# Patient Record
Sex: Female | Born: 1980 | Race: White | Hispanic: No | Marital: Married | Smoking: Never smoker
Health system: Southern US, Community
[De-identification: ages and names within clinical notes are randomized; demographics above are authoritative.]

## PROBLEM LIST (undated history)

## (undated) DIAGNOSIS — D696 Thrombocytopenia, unspecified: Secondary | ICD-10-CM

## (undated) DIAGNOSIS — O9989 Other specified diseases and conditions complicating pregnancy, childbirth and the puerperium: Secondary | ICD-10-CM

## (undated) DIAGNOSIS — J0100 Acute maxillary sinusitis, unspecified: Secondary | ICD-10-CM

## (undated) DIAGNOSIS — O99119 Other diseases of the blood and blood-forming organs and certain disorders involving the immune mechanism complicating pregnancy, unspecified trimester: Secondary | ICD-10-CM

## (undated) DIAGNOSIS — B029 Zoster without complications: Secondary | ICD-10-CM

## (undated) DIAGNOSIS — R51 Headache: Secondary | ICD-10-CM

## (undated) DIAGNOSIS — G43909 Migraine, unspecified, not intractable, without status migrainosus: Secondary | ICD-10-CM

## (undated) DIAGNOSIS — D6800 Von Willebrand disease, unspecified: Secondary | ICD-10-CM

## (undated) DIAGNOSIS — J988 Other specified respiratory disorders: Secondary | ICD-10-CM

## (undated) DIAGNOSIS — IMO0002 Reserved for concepts with insufficient information to code with codable children: Secondary | ICD-10-CM

## (undated) DIAGNOSIS — M3500 Sicca syndrome, unspecified: Secondary | ICD-10-CM

## (undated) DIAGNOSIS — M329 Systemic lupus erythematosus, unspecified: Secondary | ICD-10-CM

## (undated) DIAGNOSIS — Z8619 Personal history of other infectious and parasitic diseases: Secondary | ICD-10-CM

## (undated) HISTORY — DX: Other specified diseases and conditions complicating pregnancy, childbirth and the puerperium: O99.89

## (undated) HISTORY — DX: Von Willebrand's disease: D68.0

## (undated) HISTORY — DX: Headache: R51

## (undated) HISTORY — PX: TONSILLECTOMY: SUR1361

## (undated) HISTORY — DX: Von Willebrand disease, unspecified: D68.00

## (undated) HISTORY — DX: Zoster without complications: B02.9

## (undated) HISTORY — DX: Sjogren syndrome, unspecified: M35.00

## (undated) HISTORY — DX: Systemic lupus erythematosus, unspecified: M32.9

## (undated) HISTORY — DX: Thrombocytopenia, unspecified: D69.6

## (undated) HISTORY — DX: Personal history of other infectious and parasitic diseases: Z86.19

## (undated) HISTORY — DX: Migraine, unspecified, not intractable, without status migrainosus: G43.909

## (undated) HISTORY — DX: Reserved for concepts with insufficient information to code with codable children: IMO0002

## (undated) HISTORY — DX: Other specified respiratory disorders: J98.8

## (undated) HISTORY — DX: Other diseases of the blood and blood-forming organs and certain disorders involving the immune mechanism complicating pregnancy, unspecified trimester: O99.119

## (undated) HISTORY — DX: Acute maxillary sinusitis, unspecified: J01.00

---
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
## 2002-12-19 ENCOUNTER — Other Ambulatory Visit: Admission: RE | Admit: 2002-12-19 | Discharge: 2002-12-19 | Payer: Self-pay | Admitting: Gynecology

## 2003-04-14 ENCOUNTER — Emergency Department (HOSPITAL_COMMUNITY): Admission: EM | Admit: 2003-04-14 | Discharge: 2003-04-14 | Payer: Self-pay | Admitting: *Deleted

## 2003-04-26 ENCOUNTER — Ambulatory Visit (HOSPITAL_COMMUNITY): Admission: RE | Admit: 2003-04-26 | Discharge: 2003-04-26 | Payer: Self-pay | Admitting: Family Medicine

## 2004-01-08 ENCOUNTER — Other Ambulatory Visit: Admission: RE | Admit: 2004-01-08 | Discharge: 2004-01-08 | Payer: Self-pay | Admitting: Gynecology

## 2004-08-04 IMAGING — US US ABDOMEN COMPLETE
1 series · 14 of 25 positions shown · non-contrast
Comparison: none

CLINICAL DATA: Epigastric abdominal pain.
 COMPLETE ABDOMINAL ULTRASOUND 04/26/03 
 The liver is normal in size and echotexture and contains no focal abnormalities.  The gallbladder is well visualized and is normal in appearance.  There is no biliary ductal dilation as the common duct measures approximately 3 mm in the porta hepatis.  The pancreas is well visualized and has a normal appearance.  The spleen is normal in size and echotexture and measures approximately 9.7 cm in greatest sagittal length.  
 Both kidneys are normal in size and echotexture and contain no focal abnormalities and there is no evidence of hydronephrosis.  Cortical thickness is well preserved in both kidneys.  The right kidney measures approximately 10.0 cm and the left measures approximately 10.6 cm in greatest sagittal length.  The abdominal aorta is of normal caliber through its visualized course in the abdomen.
 IMPRESSION
 Normal abdominal ultrasound.

[Series 1: unknown · 0.30mm/px · 14 of 88 slices shown]
[im 1/88]
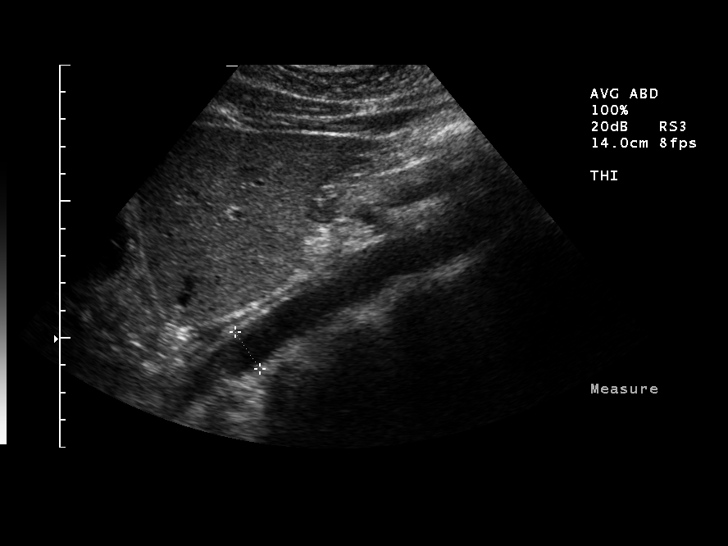
[im 8/88]
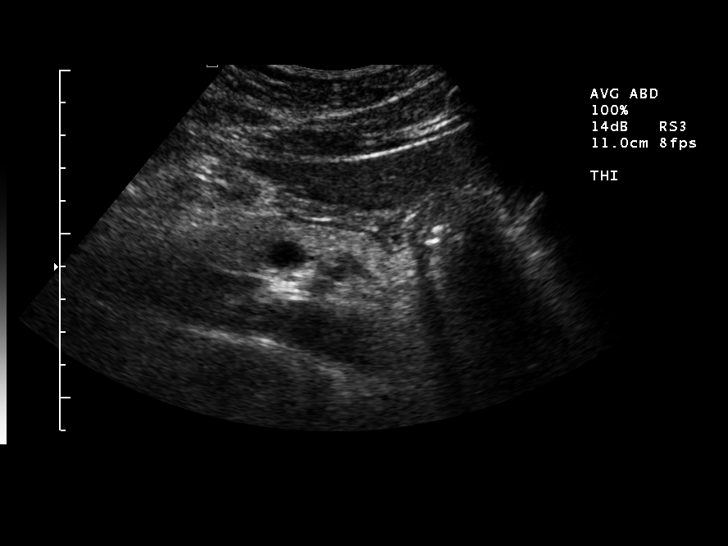
[im 15/88]
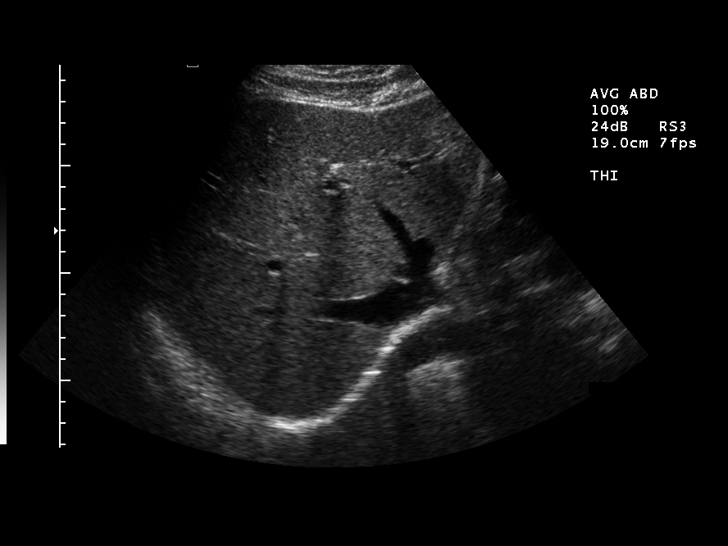
[im 22/88]
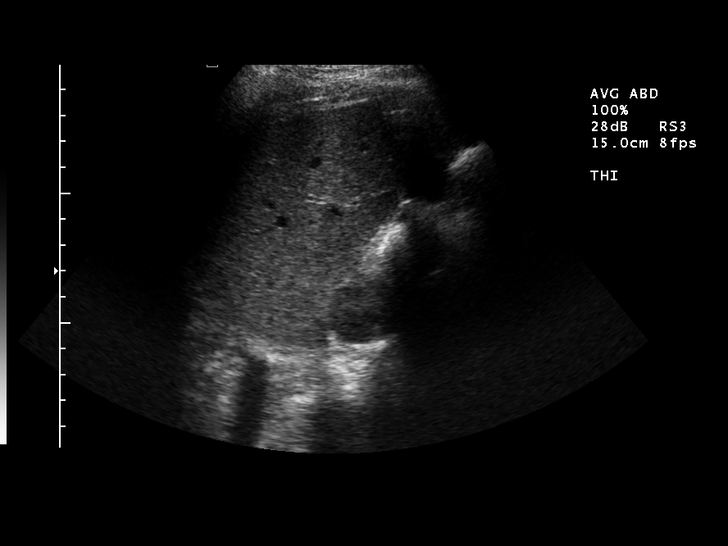
[im 30/88]
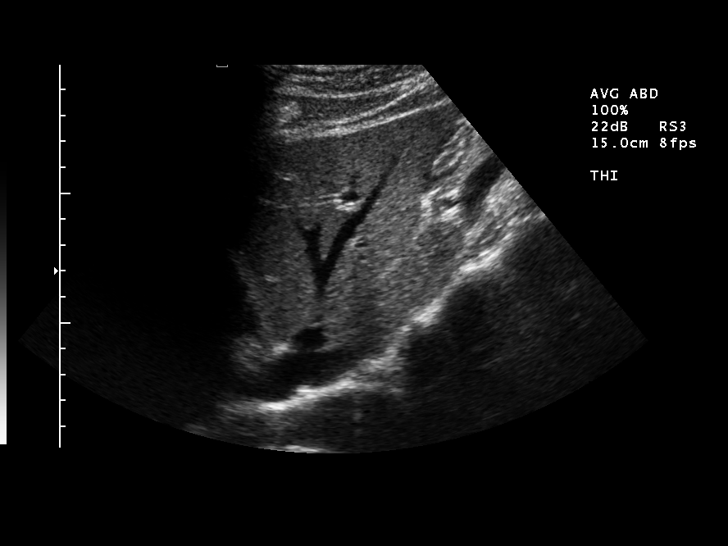
[im 33/88]
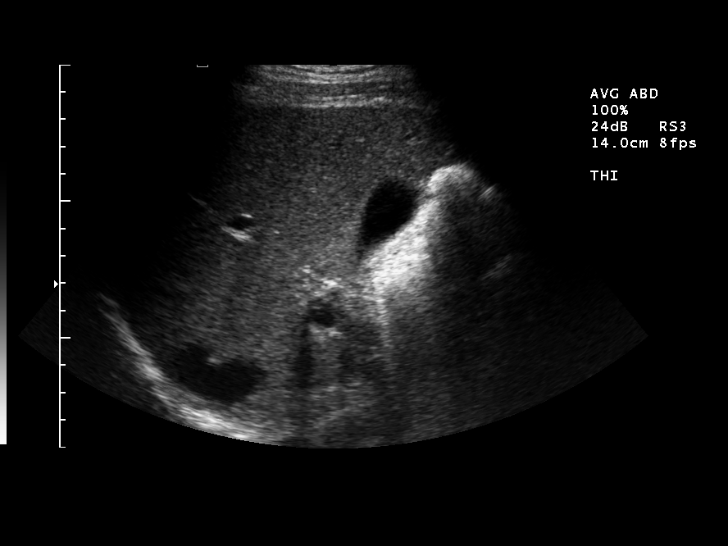
[im 40/88]
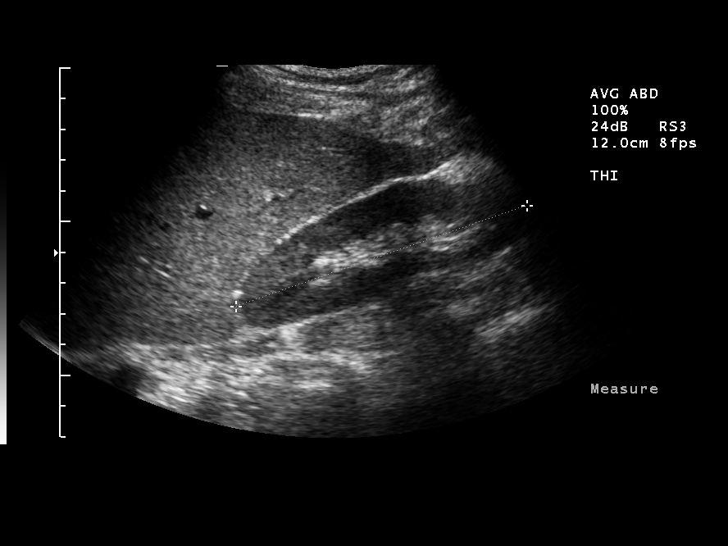
[im 48/88]
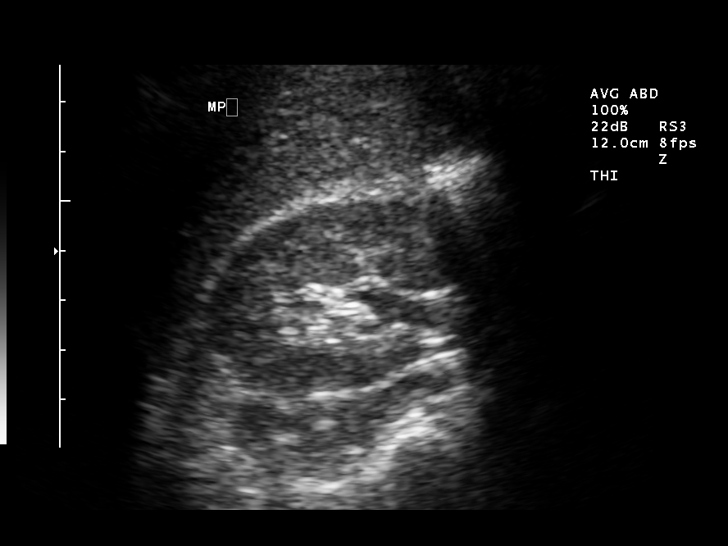
[im 55/88]
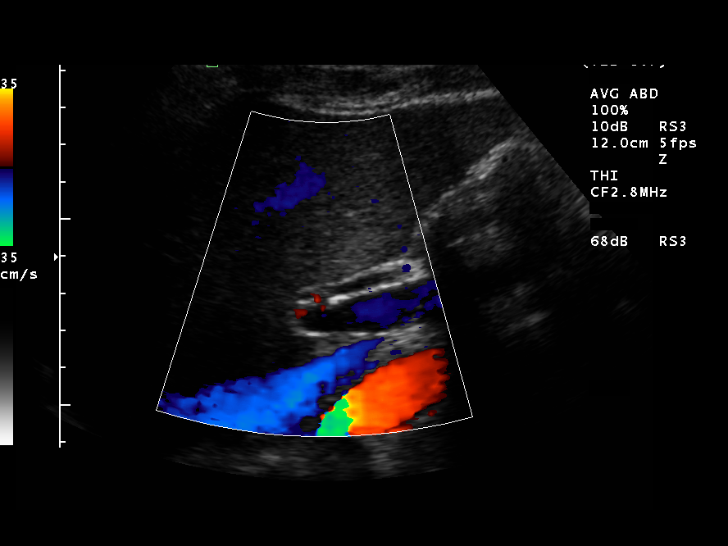
[im 59/88]
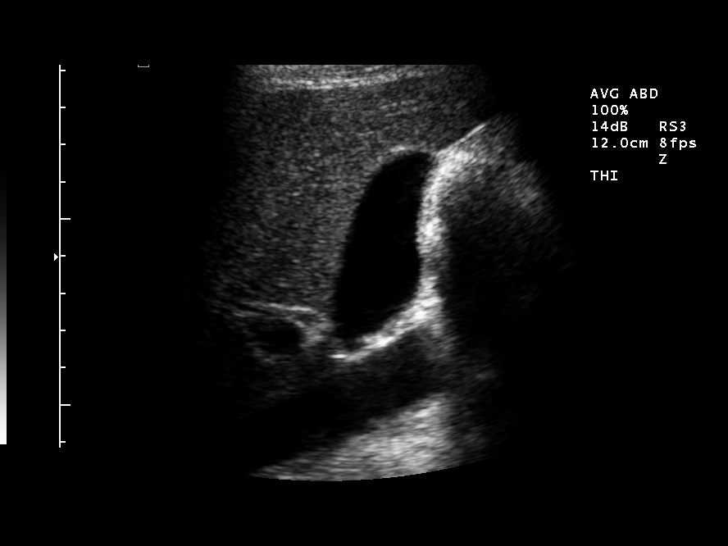
[im 66/88]
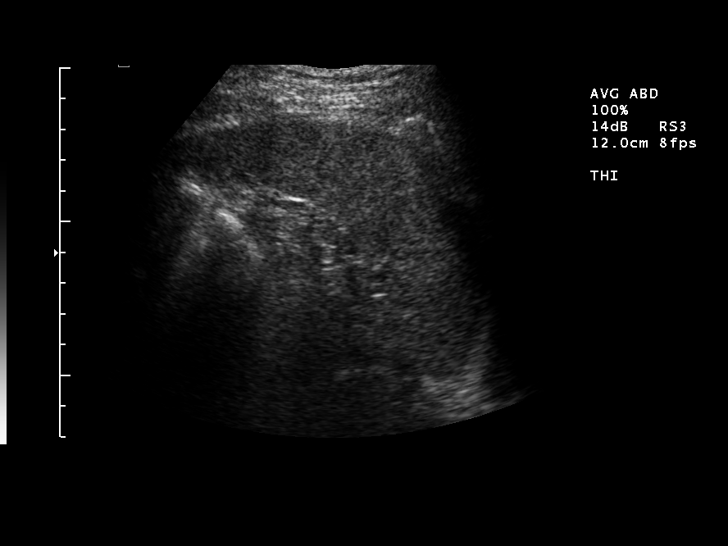
[im 73/88]
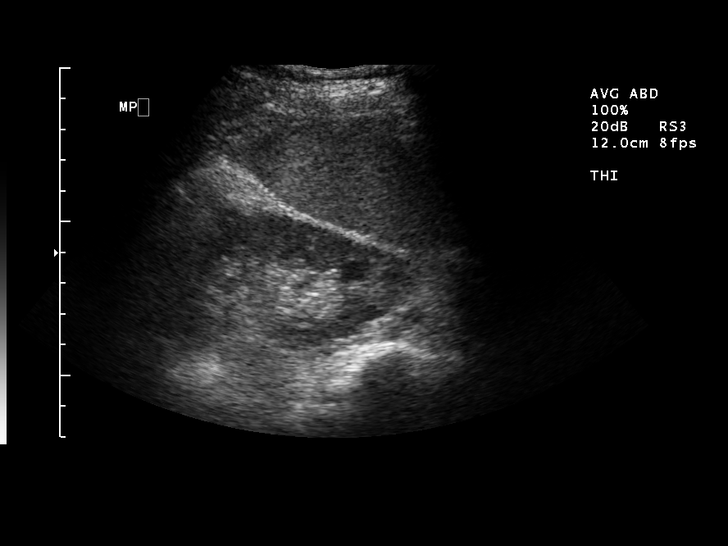
[im 80/88]
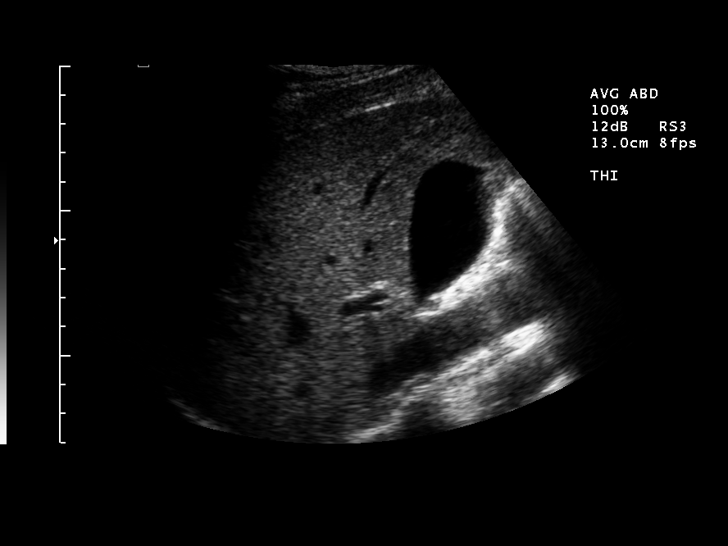
[im 88/88]
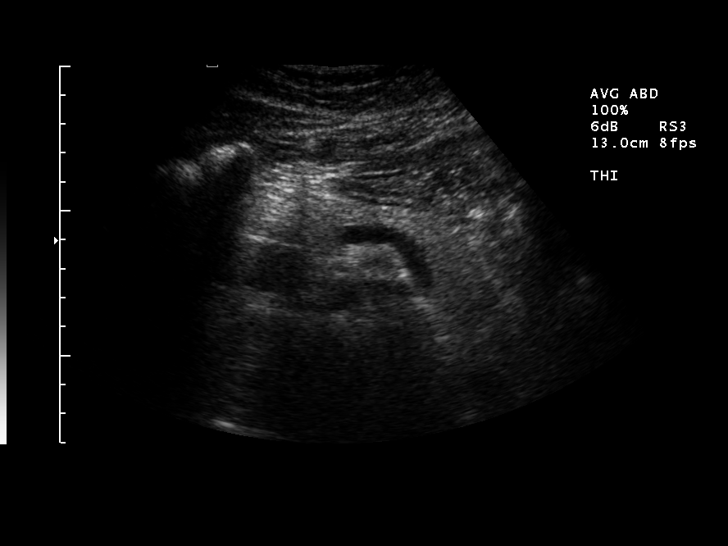

[14 of 25 positions shown; findings below may reference images not displayed]

## 2008-07-26 ENCOUNTER — Ambulatory Visit (HOSPITAL_COMMUNITY): Admission: RE | Admit: 2008-07-26 | Discharge: 2008-07-26 | Payer: Self-pay | Admitting: Obstetrics

## 2008-08-24 ENCOUNTER — Ambulatory Visit (HOSPITAL_COMMUNITY): Admission: RE | Admit: 2008-08-24 | Discharge: 2008-08-24 | Payer: Self-pay | Admitting: Obstetrics

## 2008-12-28 ENCOUNTER — Inpatient Hospital Stay (HOSPITAL_COMMUNITY): Admission: AD | Admit: 2008-12-28 | Discharge: 2008-12-30 | Payer: Self-pay | Admitting: Obstetrics

## 2009-12-03 IMAGING — US US OB FOLLOW-UP
1 series · 14 of 28 positions shown · non-contrast
Comparison: none

OBSTETRICAL ULTRASOUND:
 This ultrasound was performed in The [HOSPITAL], and the [facility] report will be stored to [REDACTED] PACS.

[Series 1: us ob follow-up · 52 acquisitions, 14 frames shown]
[im 2/52]
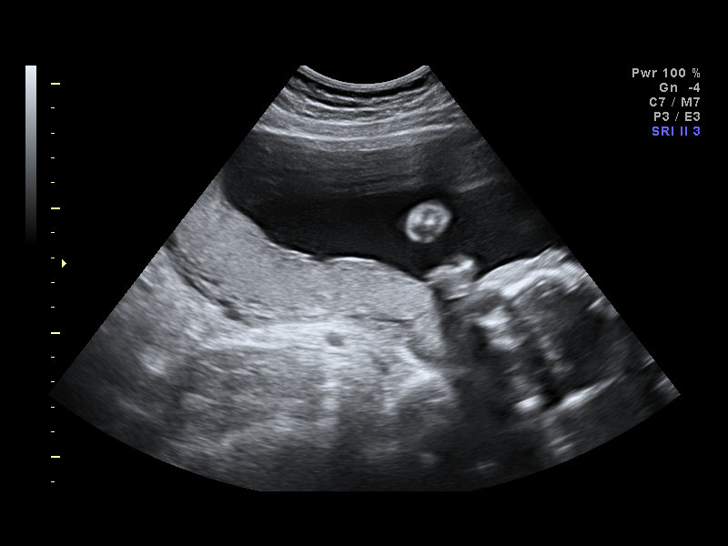
[im 6/52]
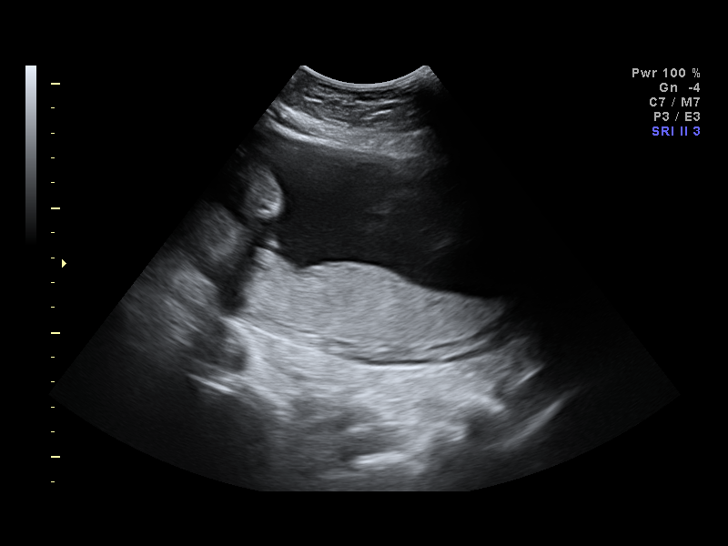
[im 10/52]
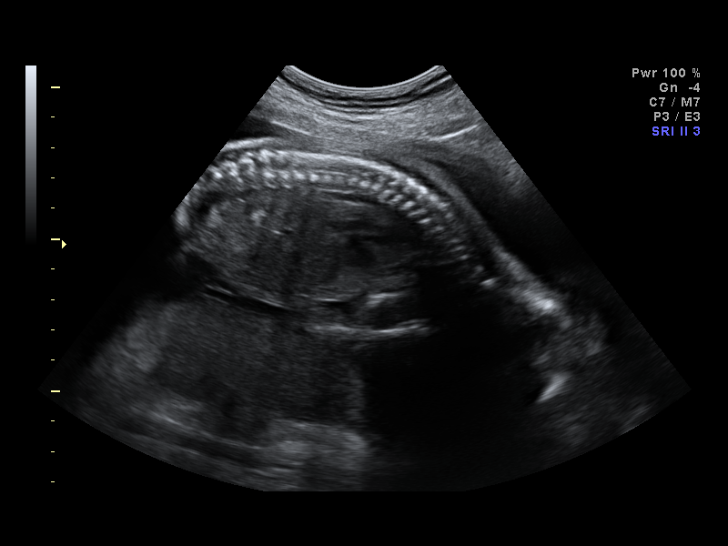
[im 14/52]
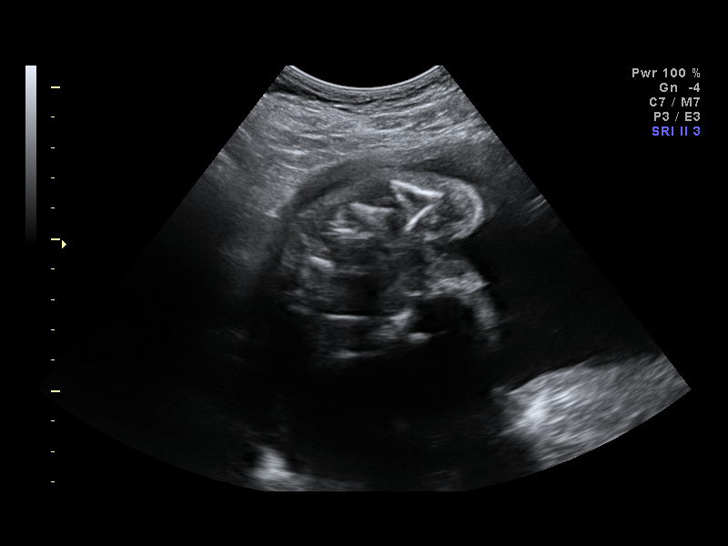
[im 18/52]
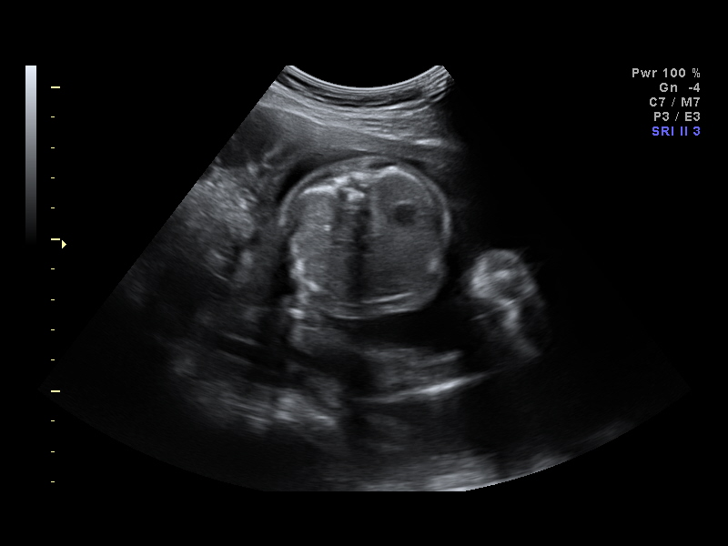
[im 21/52]
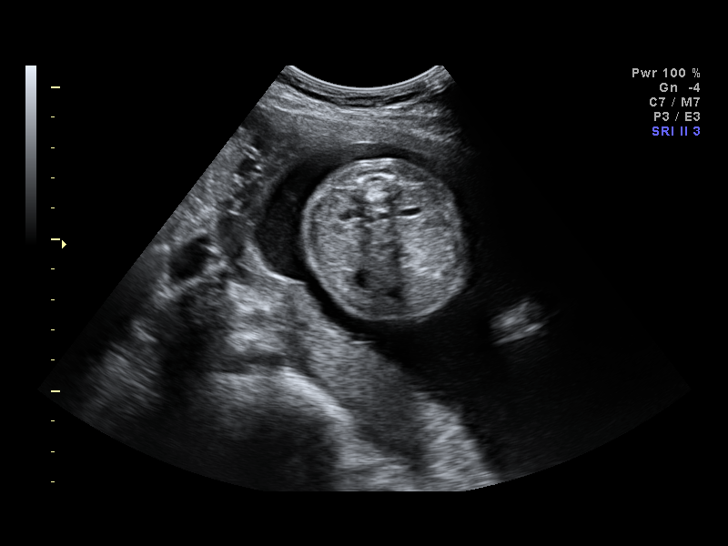
[im 25/52]
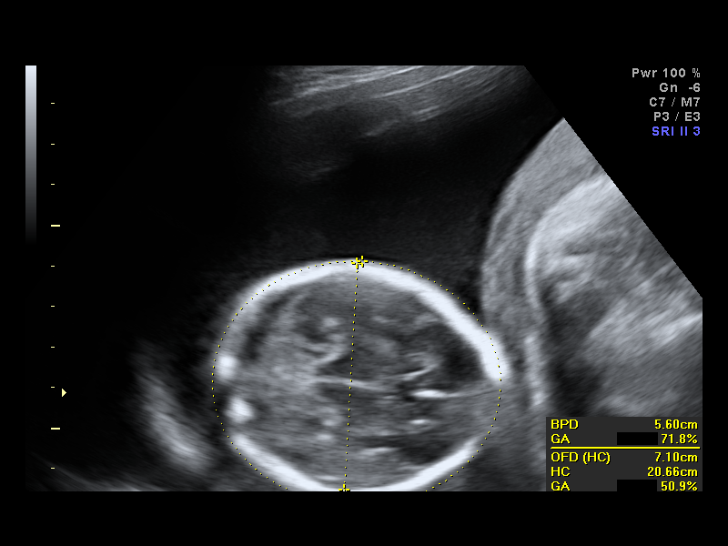
[im 29/52]
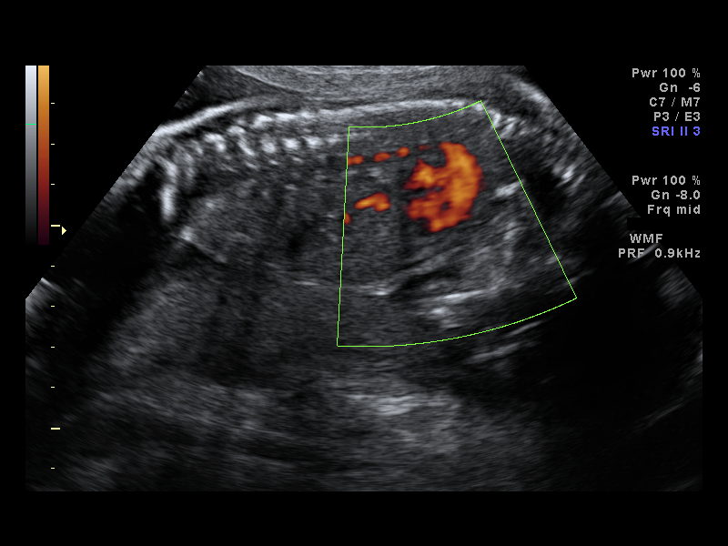
[im 33/52]
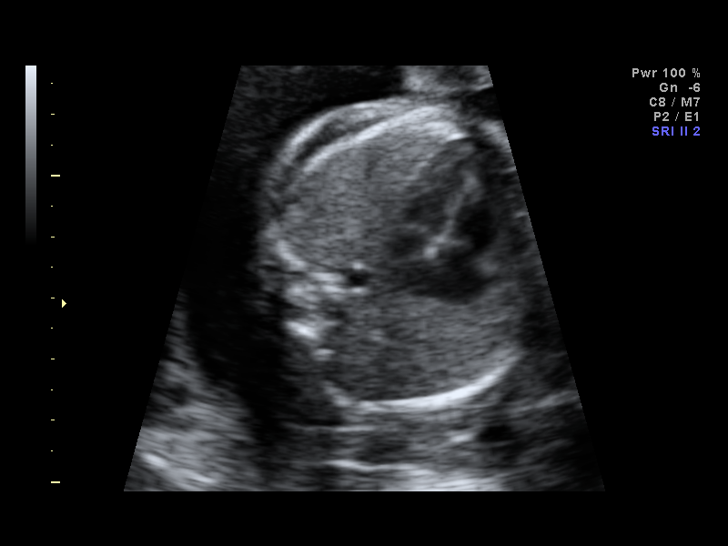
[im 36/52]
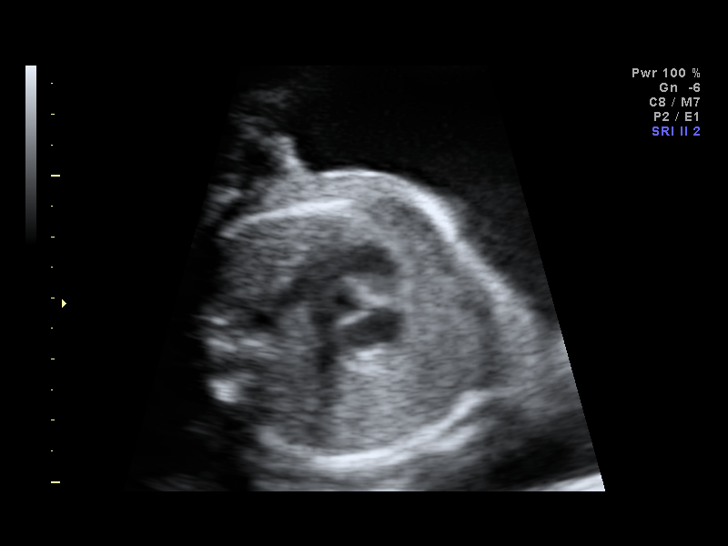
[im 40/52]
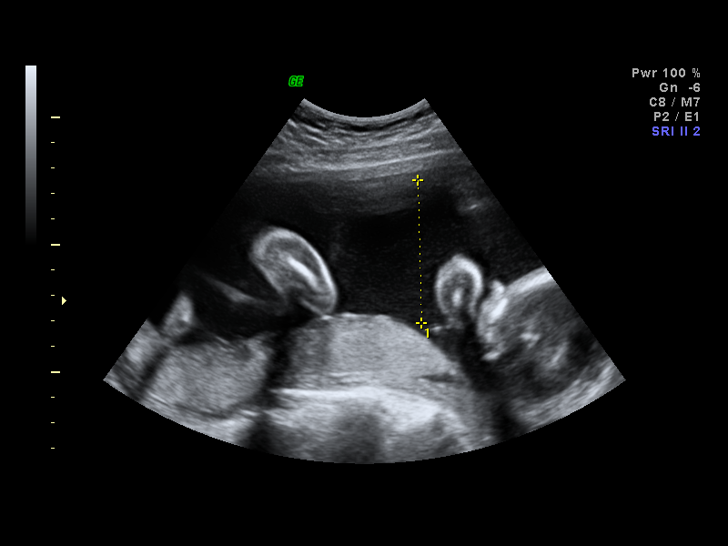
[im 44/52]
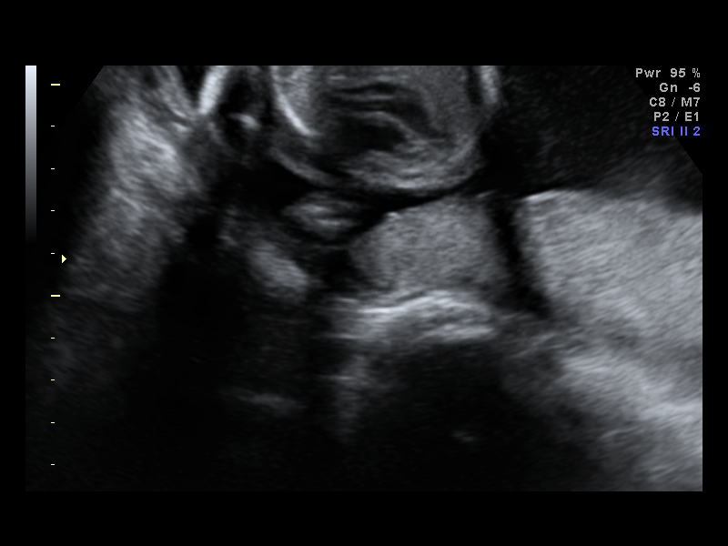
[im 48/52]
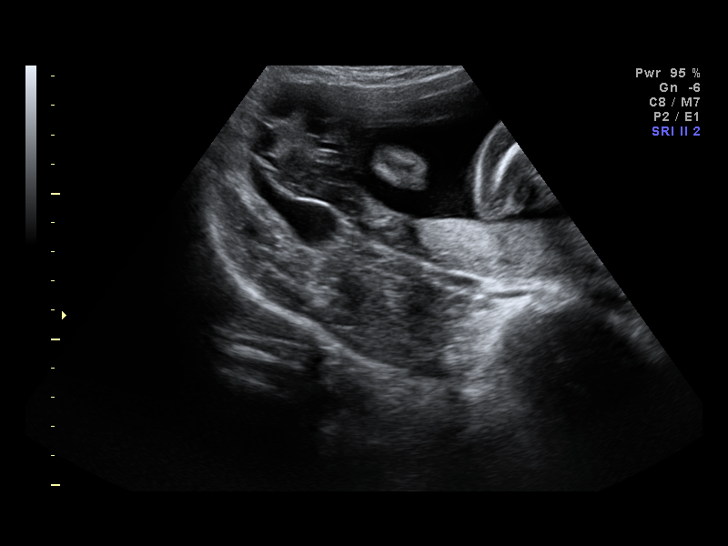
[im 52/52]
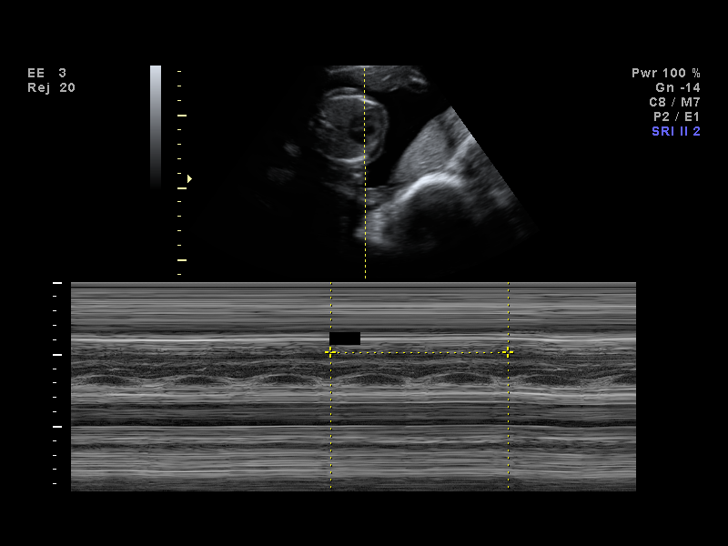

[14 of 28 positions shown; findings below may reference images not displayed]

IMPRESSION: [facility] has also been faxed to the ordering physician.

## 2010-05-19 ENCOUNTER — Encounter: Payer: Self-pay | Admitting: Obstetrics

## 2010-08-02 LAB — CBC
HCT: 35.5 % — ABNORMAL LOW (ref 36.0–46.0)
HCT: 39.6 % (ref 36.0–46.0)
Hemoglobin: 12.4 g/dL (ref 12.0–15.0)
Hemoglobin: 13 g/dL (ref 12.0–15.0)
Hemoglobin: 13.7 g/dL (ref 12.0–15.0)
Hemoglobin: 9.8 g/dL — ABNORMAL LOW (ref 12.0–15.0)
MCHC: 33.9 g/dL (ref 30.0–36.0)
MCHC: 34.8 g/dL (ref 30.0–36.0)
MCHC: 35 g/dL (ref 30.0–36.0)
MCV: 95 fL (ref 78.0–100.0)
MCV: 95.4 fL (ref 78.0–100.0)
Platelets: 122 10*3/uL — ABNORMAL LOW (ref 150–400)
Platelets: 130 10*3/uL — ABNORMAL LOW (ref 150–400)
Platelets: 137 10*3/uL — ABNORMAL LOW (ref 150–400)
RBC: 4.03 MIL/uL (ref 3.87–5.11)
RDW: 12.9 % (ref 11.5–15.5)
RDW: 13.4 % (ref 11.5–15.5)
RDW: 13.4 % (ref 11.5–15.5)
WBC: 8.7 10*3/uL (ref 4.0–10.5)
WBC: 9.1 10*3/uL (ref 4.0–10.5)

## 2010-08-02 LAB — RPR: RPR Ser Ql: NONREACTIVE

## 2010-12-16 LAB — RPR: RPR: NONREACTIVE

## 2010-12-16 LAB — GC/CHLAMYDIA PROBE AMP, GENITAL
Chlamydia: NEGATIVE
Gonorrhea: NEGATIVE

## 2010-12-16 LAB — RUBELLA ANTIBODY, IGM: Rubella: IMMUNE

## 2011-01-15 ENCOUNTER — Encounter: Payer: Self-pay | Admitting: Oncology

## 2011-01-21 ENCOUNTER — Other Ambulatory Visit (HOSPITAL_COMMUNITY): Payer: Self-pay | Admitting: Obstetrics

## 2011-01-21 DIAGNOSIS — Z3689 Encounter for other specified antenatal screening: Secondary | ICD-10-CM

## 2011-01-21 DIAGNOSIS — O2690 Pregnancy related conditions, unspecified, unspecified trimester: Secondary | ICD-10-CM

## 2011-01-22 ENCOUNTER — Other Ambulatory Visit: Payer: Self-pay | Admitting: Oncology

## 2011-01-22 ENCOUNTER — Encounter (HOSPITAL_BASED_OUTPATIENT_CLINIC_OR_DEPARTMENT_OTHER): Payer: BC Managed Care – PPO | Admitting: Oncology

## 2011-01-22 DIAGNOSIS — D6959 Other secondary thrombocytopenia: Secondary | ICD-10-CM

## 2011-01-22 LAB — CBC WITH DIFFERENTIAL/PLATELET
Basophils Absolute: 0 10*3/uL (ref 0.0–0.1)
Eosinophils Absolute: 0.1 10*3/uL (ref 0.0–0.5)
HGB: 12.2 g/dL (ref 11.6–15.9)
MONO#: 0.3 10*3/uL (ref 0.1–0.9)
MONO%: 4.8 % (ref 0.0–14.0)
NEUT#: 4.3 10*3/uL (ref 1.5–6.5)
RBC: 3.78 10*6/uL (ref 3.70–5.45)
RDW: 13 % (ref 11.2–14.5)
WBC: 6.3 10*3/uL (ref 3.9–10.3)
lymph#: 1.6 10*3/uL (ref 0.9–3.3)

## 2011-01-22 LAB — CHCC SMEAR

## 2011-01-22 LAB — MORPHOLOGY: PLT EST: NORMAL

## 2011-01-22 LAB — APTT: aPTT: 32 seconds (ref 24–37)

## 2011-02-14 ENCOUNTER — Ambulatory Visit (HOSPITAL_COMMUNITY)
Admission: RE | Admit: 2011-02-14 | Discharge: 2011-02-14 | Disposition: A | Discharge status: Home / Self Care | Payer: BC Managed Care – PPO | Source: Ambulatory Visit | Attending: Obstetrics | Admitting: Obstetrics

## 2011-02-14 ENCOUNTER — Encounter (HOSPITAL_COMMUNITY): Payer: Self-pay

## 2011-02-14 DIAGNOSIS — Z363 Encounter for antenatal screening for malformations: Secondary | ICD-10-CM | POA: Insufficient documentation

## 2011-02-14 DIAGNOSIS — Z3689 Encounter for other specified antenatal screening: Secondary | ICD-10-CM

## 2011-02-14 DIAGNOSIS — O358XX0 Maternal care for other (suspected) fetal abnormality and damage, not applicable or unspecified: Secondary | ICD-10-CM | POA: Insufficient documentation

## 2011-02-14 DIAGNOSIS — Z1389 Encounter for screening for other disorder: Secondary | ICD-10-CM | POA: Insufficient documentation

## 2011-02-14 DIAGNOSIS — O2690 Pregnancy related conditions, unspecified, unspecified trimester: Secondary | ICD-10-CM

## 2011-02-14 DIAGNOSIS — O352XX0 Maternal care for (suspected) hereditary disease in fetus, not applicable or unspecified: Secondary | ICD-10-CM | POA: Insufficient documentation

## 2011-04-07 ENCOUNTER — Other Ambulatory Visit: Payer: Self-pay

## 2011-04-29 NOTE — L&D Delivery Note (Signed)
Delivery Note At 2:08 PM a viable female was delivered via Vaginal, Spontaneous Delivery (Presentation: DOA with nuchal hand x 2 and nuchal cord x 1 ;  ).  Bradycardia to 80-90's for 8 min over 3 contractions/ 9 pushes. At this time decision made to proceed with episitomy to expedite delivery. Head delivered with next push, nuchal cord x 1 clamped x 2 and cut. Nuchal hand x 2 and delivery of shoulders after about 20 seconds, no maneuvers needed, no dystocia. APGAR: 5, 9; weight 7 lb 15 oz (3600 g).   Placenta status: Intact, Spontaneous.  Cord: 3 vessels with the following complications: None.  Cord pH: 7.25  Anesthesia: Epidural  Episiotomy: None Lacerations: 2nd degree, w/ minimal b/l sulcal extensions. Small midline episiotomy cut due to terminal bradycardia to expedite delivery Suture Repair: 3.0 vicryl rapide Est. Blood Loss (mL): 450  Mom to postpartum.  Baby to nursery-stable.  Arliene Rosenow A. 07/23/2011, 2:32 PM

## 2011-05-30 ENCOUNTER — Inpatient Hospital Stay (HOSPITAL_COMMUNITY): Admission: AD | Admit: 2011-05-30 | Payer: Self-pay | Source: Ambulatory Visit | Admitting: Obstetrics

## 2011-06-07 ENCOUNTER — Telehealth: Payer: Self-pay | Admitting: Oncology

## 2011-06-07 NOTE — Telephone Encounter (Signed)
l/m with appt for 08/18/11 per pof from 01/22/11 aom

## 2011-07-10 ENCOUNTER — Other Ambulatory Visit: Payer: Self-pay | Admitting: Obstetrics

## 2011-07-14 ENCOUNTER — Encounter (HOSPITAL_COMMUNITY): Payer: Self-pay | Admitting: *Deleted

## 2011-07-14 ENCOUNTER — Telehealth (HOSPITAL_COMMUNITY): Payer: Self-pay | Admitting: *Deleted

## 2011-07-14 NOTE — Telephone Encounter (Signed)
Preadmission screen  

## 2011-07-15 ENCOUNTER — Encounter (HOSPITAL_COMMUNITY): Payer: Self-pay | Admitting: *Deleted

## 2011-07-15 ENCOUNTER — Telehealth (HOSPITAL_COMMUNITY): Payer: Self-pay | Admitting: *Deleted

## 2011-07-15 NOTE — Telephone Encounter (Signed)
Preadmission screen  

## 2011-07-23 ENCOUNTER — Inpatient Hospital Stay (HOSPITAL_COMMUNITY)
Admission: RE | Admit: 2011-07-23 | Discharge: 2011-07-25 | DRG: 373 | Disposition: A | Discharge status: Home / Self Care | Payer: BC Managed Care – PPO | Source: Ambulatory Visit | Attending: Obstetrics | Admitting: Obstetrics

## 2011-07-23 ENCOUNTER — Inpatient Hospital Stay (HOSPITAL_COMMUNITY): Payer: BC Managed Care – PPO | Admitting: Anesthesiology

## 2011-07-23 ENCOUNTER — Encounter (HOSPITAL_COMMUNITY): Payer: Self-pay

## 2011-07-23 ENCOUNTER — Encounter (HOSPITAL_COMMUNITY): Payer: Self-pay | Admitting: Anesthesiology

## 2011-07-23 DIAGNOSIS — O480 Post-term pregnancy: Principal | ICD-10-CM | POA: Diagnosis present

## 2011-07-23 DIAGNOSIS — D689 Coagulation defect, unspecified: Secondary | ICD-10-CM | POA: Diagnosis present

## 2011-07-23 DIAGNOSIS — D696 Thrombocytopenia, unspecified: Secondary | ICD-10-CM

## 2011-07-23 HISTORY — DX: Thrombocytopenia, unspecified: D69.6

## 2011-07-23 LAB — ABO/RH: ABO/RH(D): B POS

## 2011-07-23 LAB — CBC
HCT: 35.1 % — ABNORMAL LOW (ref 36.0–46.0)
MCV: 90.9 fL (ref 78.0–100.0)
Platelets: 124 10*3/uL — ABNORMAL LOW (ref 150–400)
RBC: 3.86 MIL/uL — ABNORMAL LOW (ref 3.87–5.11)
RDW: 13.8 % (ref 11.5–15.5)
WBC: 7.3 10*3/uL (ref 4.0–10.5)

## 2011-07-23 LAB — TYPE AND SCREEN: Antibody Screen: NEGATIVE

## 2011-07-23 LAB — RPR: RPR Ser Ql: NONREACTIVE

## 2011-07-23 MED ORDER — FENTANYL 2.5 MCG/ML BUPIVACAINE 1/10 % EPIDURAL INFUSION (WH - ANES)
INTRAMUSCULAR | Status: DC | PRN
  Administered 2011-07-23: 14 mL/h via EPIDURAL

## 2011-07-23 MED ORDER — DIPHENHYDRAMINE HCL 50 MG/ML IJ SOLN: 12.5000 mg | INTRAMUSCULAR | Status: DC | PRN

## 2011-07-23 MED ORDER — LIDOCAINE HCL (PF) 1 % IJ SOLN: 30.0000 mL | INTRAMUSCULAR | Status: DC | PRN

## 2011-07-23 MED ORDER — SENNOSIDES-DOCUSATE SODIUM 8.6-50 MG PO TABS
2.0000 | ORAL_TABLET | Freq: Every day | ORAL | Status: DC
  Administered 2011-07-23 – 2011-07-24 (×2): 2 via ORAL

## 2011-07-23 MED ORDER — ZOLPIDEM TARTRATE 10 MG PO TABS: 10.0000 mg | ORAL_TABLET | Freq: Every evening | ORAL | Status: DC | PRN

## 2011-07-23 MED ORDER — ACETAMINOPHEN 325 MG PO TABS: 650.0000 mg | ORAL_TABLET | ORAL | Status: DC | PRN

## 2011-07-23 MED ORDER — ONDANSETRON HCL 4 MG/2ML IJ SOLN: 4.0000 mg | Freq: Four times a day (QID) | INTRAMUSCULAR | Status: DC | PRN

## 2011-07-23 MED ORDER — TETANUS-DIPHTH-ACELL PERTUSSIS 5-2.5-18.5 LF-MCG/0.5 IM SUSP
0.5000 mL | Freq: Once | INTRAMUSCULAR | Status: AC
  Administered 2011-07-25: 0.5 mL via INTRAMUSCULAR
  Filled 2011-07-23 (×2): qty 0.5

## 2011-07-23 MED ORDER — OXYCODONE-ACETAMINOPHEN 5-325 MG PO TABS
1.0000 | ORAL_TABLET | ORAL | Status: DC | PRN
  Administered 2011-07-23 – 2011-07-24 (×2): 1 via ORAL
  Filled 2011-07-23 (×2): qty 1

## 2011-07-23 MED ORDER — EPHEDRINE 5 MG/ML INJ
10.0000 mg | INTRAVENOUS | Status: DC | PRN
  Filled 2011-07-23: qty 4

## 2011-07-23 MED ORDER — OXYTOCIN BOLUS FROM INFUSION
500.0000 mL | Freq: Once | INTRAVENOUS | Status: DC
  Filled 2011-07-23: qty 500
  Filled 2011-07-23: qty 1000

## 2011-07-23 MED ORDER — FLEET ENEMA 7-19 GM/118ML RE ENEM: 1.0000 | ENEMA | RECTAL | Status: DC | PRN

## 2011-07-23 MED ORDER — LANOLIN HYDROUS EX OINT: TOPICAL_OINTMENT | CUTANEOUS | Status: DC | PRN

## 2011-07-23 MED ORDER — ZOLPIDEM TARTRATE 5 MG PO TABS: 5.0000 mg | ORAL_TABLET | Freq: Every evening | ORAL | Status: DC | PRN

## 2011-07-23 MED ORDER — LACTATED RINGERS IV SOLN
INTRAVENOUS | Status: DC
  Administered 2011-07-23: 08:00:00 via INTRAVENOUS

## 2011-07-23 MED ORDER — BENZOCAINE-MENTHOL 20-0.5 % EX AERO
INHALATION_SPRAY | CUTANEOUS | Status: AC
  Administered 2011-07-23: 1 via TOPICAL
  Filled 2011-07-23: qty 56

## 2011-07-23 MED ORDER — IBUPROFEN 600 MG PO TABS: 600.0000 mg | ORAL_TABLET | Freq: Four times a day (QID) | ORAL | Status: DC | PRN

## 2011-07-23 MED ORDER — OXYTOCIN 20 UNITS IN LACTATED RINGERS INFUSION - SIMPLE
1.0000 m[IU]/min | INTRAVENOUS | Status: DC
  Administered 2011-07-23: 2 m[IU]/min via INTRAVENOUS

## 2011-07-23 MED ORDER — EPHEDRINE 5 MG/ML INJ: 10.0000 mg | INTRAVENOUS | Status: DC | PRN

## 2011-07-23 MED ORDER — LACTATED RINGERS IV SOLN: 500.0000 mL | Freq: Once | INTRAVENOUS | Status: DC

## 2011-07-23 MED ORDER — OXYTOCIN 20 UNITS IN LACTATED RINGERS INFUSION - SIMPLE: 125.0000 mL/h | Freq: Once | INTRAVENOUS | Status: DC

## 2011-07-23 MED ORDER — TERBUTALINE SULFATE 1 MG/ML IJ SOLN: 0.2500 mg | Freq: Once | INTRAMUSCULAR | Status: DC | PRN

## 2011-07-23 MED ORDER — PHENYLEPHRINE 40 MCG/ML (10ML) SYRINGE FOR IV PUSH (FOR BLOOD PRESSURE SUPPORT): 80.0000 ug | PREFILLED_SYRINGE | INTRAVENOUS | Status: DC | PRN

## 2011-07-23 MED ORDER — CITRIC ACID-SODIUM CITRATE 334-500 MG/5ML PO SOLN: 30.0000 mL | ORAL | Status: DC | PRN

## 2011-07-23 MED ORDER — DIBUCAINE 1 % RE OINT
1.0000 "application " | TOPICAL_OINTMENT | RECTAL | Status: DC | PRN
  Filled 2011-07-23: qty 28

## 2011-07-23 MED ORDER — OXYTOCIN 20 UNITS IN LACTATED RINGERS INFUSION - SIMPLE: 1.0000 m[IU]/min | INTRAVENOUS | Status: DC

## 2011-07-23 MED ORDER — PHENYLEPHRINE 40 MCG/ML (10ML) SYRINGE FOR IV PUSH (FOR BLOOD PRESSURE SUPPORT)
80.0000 ug | PREFILLED_SYRINGE | INTRAVENOUS | Status: DC | PRN
  Filled 2011-07-23: qty 5

## 2011-07-23 MED ORDER — BISACODYL 10 MG RE SUPP
10.0000 mg | Freq: Every day | RECTAL | Status: DC | PRN
  Filled 2011-07-23: qty 1

## 2011-07-23 MED ORDER — BENZOCAINE-MENTHOL 20-0.5 % EX AERO
1.0000 "application " | INHALATION_SPRAY | CUTANEOUS | Status: DC | PRN
  Administered 2011-07-23: 1 via TOPICAL
  Filled 2011-07-23: qty 56

## 2011-07-23 MED ORDER — OXYCODONE-ACETAMINOPHEN 5-325 MG PO TABS: 1.0000 | ORAL_TABLET | ORAL | Status: DC | PRN

## 2011-07-23 MED ORDER — ONDANSETRON HCL 4 MG/2ML IJ SOLN: 4.0000 mg | INTRAMUSCULAR | Status: DC | PRN

## 2011-07-23 MED ORDER — WITCH HAZEL-GLYCERIN EX PADS: 1.0000 "application " | MEDICATED_PAD | CUTANEOUS | Status: DC | PRN

## 2011-07-23 MED ORDER — FLEET ENEMA 7-19 GM/118ML RE ENEM: 1.0000 | ENEMA | Freq: Every day | RECTAL | Status: DC | PRN

## 2011-07-23 MED ORDER — SODIUM BICARBONATE 8.4 % IV SOLN
INTRAVENOUS | Status: DC | PRN
  Administered 2011-07-23: 4 mL via EPIDURAL

## 2011-07-23 MED ORDER — ONDANSETRON HCL 4 MG PO TABS: 4.0000 mg | ORAL_TABLET | ORAL | Status: DC | PRN

## 2011-07-23 MED ORDER — PRENATAL MULTIVITAMIN CH
1.0000 | ORAL_TABLET | Freq: Every day | ORAL | Status: DC
  Administered 2011-07-23 – 2011-07-24 (×2): 1 via ORAL
  Filled 2011-07-23 (×3): qty 1

## 2011-07-23 MED ORDER — IBUPROFEN 600 MG PO TABS
600.0000 mg | ORAL_TABLET | Freq: Four times a day (QID) | ORAL | Status: DC
  Administered 2011-07-23 – 2011-07-25 (×7): 600 mg via ORAL
  Filled 2011-07-23 (×7): qty 1

## 2011-07-23 MED ORDER — SIMETHICONE 80 MG PO CHEW: 80.0000 mg | CHEWABLE_TABLET | ORAL | Status: DC | PRN

## 2011-07-23 MED ORDER — FENTANYL 2.5 MCG/ML BUPIVACAINE 1/10 % EPIDURAL INFUSION (WH - ANES)
14.0000 mL/h | INTRAMUSCULAR | Status: DC
  Filled 2011-07-23: qty 60

## 2011-07-23 MED ORDER — LACTATED RINGERS IV SOLN: 500.0000 mL | INTRAVENOUS | Status: DC | PRN

## 2011-07-23 MED ORDER — DIPHENHYDRAMINE HCL 25 MG PO CAPS: 25.0000 mg | ORAL_CAPSULE | Freq: Four times a day (QID) | ORAL | Status: DC | PRN

## 2011-07-23 NOTE — Progress Notes (Signed)
Pt transferred to [facility] via wheelchair.

## 2011-07-23 NOTE — H&P (Signed)
Autumn Henderson is a 31 y.o. G2P1001 at [redacted]w[redacted]d presenting for post-dates IOL. Pt notes rare contractions. Good fetal movement, No vaginal bleeding, not leaking fluid. No bruising, gum bleeding  [facility] 6 wks - h/o thrombocytopenia in last preg, interval plts low-normal in 110's, increased to140s' in last few wks - twin sister w/ cleft palate, nl L-II u./s and folic acid supplementation  OB History    Grav Para Term Preterm Abortions TAB SAB Ect Mult Living   2 1 1  0 0 0 0 0 0 1     Past Medical History  Diagnosis Date  . History of chicken pox   . Asthma   . Other diseases of respiratory system, not elsewhere classified   . Other current maternal conditions classifiable elsewhere, complicating pregnancy, childbirth, or the puerperium, unspecified as to episode of care     glucosuria  . Acute maxillary sinusitis   . Thrombocytopenia complicating pregnancy   . Headache    Past Surgical History  Procedure Date  . Tonsillectomy     as child had bleeding problems and long healin   Family History: family history includes Birth defects in her mother and sister and Stroke in her maternal grandfather and paternal grandfather. Social History:  reports that she has never smoked. She has never used smokeless tobacco. She reports that she does not drink alcohol or use illicit drugs.  Meds: PNV, folic acid NKDA  Review of Systems - Negative except rare ctx, good FM     Last menstrual period 10/10/2010.  Physical Exam:  Gen: well appearing, no distress  Back: no CVAT Abd: gravid, NT, no RUQ pain, EFW 8# LE: no edema, equal bilaterally, non-tender Toco: pendine FH:not yet on monitor  Cvx: 2-3 cm/ 20%/ mid/ med/ vtx -3  Prenatal labs: ABO, Rh: B/Positive/-- (08/20 0000) Antibody: Negative (08/20 0000) Rubella:  immune RPR: Nonreactive (08/20 0000)  HBsAg: Negative (08/20 0000)  HIV: Non-reactive (08/20 0000)  GBS: Negative (02/28 0000)  1 hr Glucola  97  Genetic screening nl quad Anatomy US nl   Assessment/Plan: 31 y.o. G2P1001 at [redacted]w[redacted]d IOL at 40+ wks/ Plan AROM then pit, possible pit then AROM pending physician availability - h/o thrombocytopenia. Check CBC prior to intervention - GBS neg   Autumn Henderson A. 07/23/2011, 7:53 AM

## 2011-07-23 NOTE — Anesthesia Procedure Notes (Addendum)
Epidural Patient location during procedure: OB  Preanesthetic Checklist Completed: patient identified, site marked, surgical consent, pre-op evaluation, timeout performed, IV checked, risks and benefits discussed and monitors and equipment checked  Epidural Patient position: sitting Prep: site prepped and draped and DuraPrep Patient monitoring: continuous pulse ox and blood pressure Approach: midline Injection technique: LOR air  Needle:  Needle type: Tuohy  Needle gauge: 17 G Needle length: 9 cm Needle insertion depth: 5 cm cm Catheter type: closed end flexible Catheter size: 19 Gauge Catheter at skin depth: 10 cm Test dose: negative  Assessment Events: blood not aspirated, injection not painful, no injection resistance, negative IV test and no paresthesia  Additional Notes Dosing of Epidural:  1st dose, through needle ............................................Marland Kitchen epi 1:200K + Xylocaine 40 mg  2nd dose, through catheter, after waiting 3 minutes...Marland KitchenMarland Kitchenepi 1:200K + Xylocaine 40 mg  3rd dose, through catheter after waiting 3 minutes .............................Marcaine   4mg    ( mg Marcaine are expressed as equivilent  cc's medication removed from the 0.1%Bupiv / fentanyl syringe from L&D pump)  ( 2% Xylo charted as a single dose in Epic Meds for ease of charting; actual dosing was fractionated as above, for saftey's sake)  As each dose occurred, patient was free of IV sx; and patient exhibited no evidence of SA injection.  Patient is more comfortable after epidural dosed, but R > L block. Will redose after pt is RLD, and Vernona Rieger will call if patient has pain in 20". Please see RN's note for documentation of vital signs,and FHR which are stable.   Epidural Patient location during procedure: OB  Preanesthetic Checklist Completed: patient identified, site marked, surgical consent, pre-op evaluation, timeout performed, IV checked, risks and benefits discussed and monitors and  equipment checked  Epidural Patient position: sitting Prep: site prepped and draped and DuraPrep Patient monitoring: continuous pulse ox and blood pressure Approach: midline Injection technique: LOR air  Needle:  Needle type: Tuohy  Needle gauge: 17 G Needle length: 9 cm Needle insertion depth: 5 cm cm Catheter type: closed end flexible Catheter size: 19 Gauge Catheter at skin depth: 10 cm Test dose: negative  Assessment Events: blood not aspirated, injection not painful, no injection resistance, negative IV test and no paresthesia  Additional Notes Dosing of Epidural:  1st dose, through needle ............................................Marland Kitchen epi 1:200K + Xylocaine 40 mg  2nd dose, through catheter, after waiting 3 minutes...Marland KitchenMarland Kitchenepi 1:200K + Xylocaine 40 mg    ( 2% Xylo charted as a single dose in Epic Meds for ease of charting; actual dosing was fractionated as above, for saftey's sake)  As each dose occurred, patient was free of IV sx; and patient exhibited no evidence of SA injection.  Patient is more comfortable after epidural dosed. Please see RN's note for documentation of vital signs,and FHR which are stable.

## 2011-07-23 NOTE — Progress Notes (Signed)
Here for IOL  Comfortable  AROM- clear, cvx 3/20%/ vtx -3  Toco: rare  Fh 150s' reactive  A/P: IOL, post dates  Autumn Henderson A. 07/23/2011 8:35 AM

## 2011-07-23 NOTE — Anesthesia Preprocedure Evaluation (Addendum)
Anesthesia Evaluation  Patient identified by MRN, date of birth, ID band Patient awake    Reviewed: Allergy & Precautions, H&P , Patient's Chart, lab work & pertinent test results  Airway Mallampati: II TM Distance: >3 FB Neck ROM: full    Dental No notable dental hx. (+) Teeth Intact   Pulmonary  breath sounds clear to auscultation  Pulmonary exam normal       Cardiovascular Exercise Tolerance: Good Rhythm:regular Rate:Normal     Neuro/Psych    GI/Hepatic   Endo/Other    Renal/GU      Musculoskeletal   Abdominal   Peds  Hematology   Anesthesia Other Findings    Hx of Thrombocytopenia, currently 124K   Reproductive/Obstetrics (+) Pregnancy                         Anesthesia Physical Anesthesia Plan  ASA: II  Anesthesia Plan: Epidural   Post-op Pain Management:    Induction:   Airway Management Planned:   Additional Equipment:   Intra-op Plan:   Post-operative Plan:   Informed Consent: I have reviewed the patients History and Physical, chart, labs and discussed the procedure including the risks, benefits and alternatives for the proposed anesthesia with the patient or authorized representative who has indicated his/her understanding and acceptance.   Dental Advisory Given  Plan Discussed with:   Anesthesia Plan Comments: (Labs checked- platelets confirmed with RN in room. Fetal heart tracing, per RN, reported to be stable enough for sitting procedure. Discussed epidural, and patient consents to the procedure:  included risk of possible headache,backache, failed block, allergic reaction, and nerve injury. This patient was asked if she had any questions or concerns before the procedure started. )        Anesthesia Quick Evaluation

## 2011-07-23 NOTE — Progress Notes (Signed)
S: Doing well, no complaints, pain well controlled without epidural. Rare ctx. Cont to leak fluid  O: BP 108/59  Pulse 61  Temp(Src) 98.4 F (36.9 C) (Oral)  Resp 20  LMP 10/10/2010   FHT:  FHR: 150s bpm, variability: moderate,  accelerations:  Present,  decelerations:  Absent UC:   irregular, every 5-7 minutes SVE:   Dilation: 3.5 Effacement (%): 30 Station: -2 Exam by:: Rajah Tagliaferro   A / P:  31 y.o.  Obstetric History   G2   P1   T1   P0   A0   TAB0   SAB0   E0   M0   L1    at [redacted]w[redacted]d Induction of labor due to postterm,  progressing well on pitocin  Fetal Wellbeing:  Category I Pain Control:  planning epidural  Anticipated MOD:  NSVD  Start pitocin now as no change 2-3 hrs from AROM Plts stable  Demarko Zeimet A. 07/23/2011, 11:22 AM

## 2011-07-24 ENCOUNTER — Encounter (HOSPITAL_COMMUNITY): Payer: Self-pay

## 2011-07-24 DIAGNOSIS — D696 Thrombocytopenia, unspecified: Secondary | ICD-10-CM

## 2011-07-24 HISTORY — DX: Thrombocytopenia, unspecified: D69.6

## 2011-07-24 LAB — CBC
HCT: 31.6 % — ABNORMAL LOW (ref 36.0–46.0)
Hemoglobin: 10.7 g/dL — ABNORMAL LOW (ref 12.0–15.0)
MCH: 31.1 pg (ref 26.0–34.0)
MCHC: 33.9 g/dL (ref 30.0–36.0)
RBC: 3.44 MIL/uL — ABNORMAL LOW (ref 3.87–5.11)

## 2011-07-24 NOTE — Addendum Note (Signed)
Addendum  created 07/24/11 1610 by Graciela Husbands, CRNA   Modules edited:Charges VN, Notes Section

## 2011-07-24 NOTE — Progress Notes (Signed)
PPD 1 SVD  S:  Reports feeling well.             Tolerating po/ No nausea or vomiting             Bleeding is light at present, had some small clots last night.             Pain controlled with Motrin and Percocet.             Up ad lib / ambulatory  Newborn  Information for the patient's newborn:  Kaedence, Connelly [[id number]]  female  breats feeding, no concerns  / Circumcision planned today   O:  A & O x 3 NAD             VS: Blood pressure 105/63, pulse 63, temperature 97.5 F (36.4 C), temperature source Oral, resp. rate 18, height 5\' 8"  (1.727 m), weight 64.864 kg (143 lb), last menstrual period 10/10/2010, SpO2 100.00%, unknown if currently breastfeeding.  LABS:  Basename 07/24/11 0530 07/23/11 0800  WBC 10.7* 7.3  HGB 10.7* 12.1  HCT 31.6* 35.1*  PLT 108* 124*    I&O: I/O last 3 completed shifts: In: -  Out: 900 [Blood:900]      Lungs: Clear and unlabored  Heart: regular rate and rhythm / no mumurs  Abdomen: soft, non-tender, non-distended              Fundus: firm, non-tender, U-2  Perineum: intact repair, mild edema, ice in place  Lochia: scant  Extremities: no edema, no calf pain or tenderness, neg Homans    A/P: PPD # 1 31 y.o., [id number]    Principal Problem:  *PP care - s/p NVB 3/27  Doing well - stable status  Routine post partum orders  Active Problems:   Thrombocytopenia, no hemorrhage  Rpt plts in AM     Anticipate discharge home in AM.   Jaxden Blyden, CNM, MSN 07/24/2011, 8:33 AM

## 2011-07-24 NOTE — Anesthesia Postprocedure Evaluation (Signed)
  Anesthesia Post-op Note  Patient: Autumn Henderson  Procedure(s) Performed: * No procedures listed *  Patient Location: 122  Anesthesia Type: Epidural  Level of Consciousness: awake, alert  and oriented  Airway and Oxygen Therapy: Patient Spontanous Breathing  Post-op Pain: mild  Post-op Assessment: Post-op Vital signs reviewed, Patient's Cardiovascular Status Stable, No headache, No backache, No residual numbness and No residual motor weakness  Post-op Vital Signs: Reviewed and stable  Complications: No apparent anesthesia complications

## 2011-07-25 MED ORDER — IBUPROFEN 600 MG PO TABS: 600.0000 mg | ORAL_TABLET | Freq: Four times a day (QID) | ORAL | Status: AC | PRN

## 2011-07-25 NOTE — Discharge Summary (Signed)
Obstetric Discharge Summary Reason for Admission: induction of labor and postdates    Gestational Thrombocytopenia Prenatal Procedures: ultrasound Intrapartum Procedures: spontaneous vaginal delivery Postpartum Procedures: none Complications-Operative and Postpartum: 2nd degree perineal laceration HGB  Date Value Range Status  01/22/2011 12.2  11.6-15.9 (g/dL) Final     Hemoglobin  Date Value Range Status  07/24/2011 10.7* 12.0-15.0 (g/dL) Final     HCT  Date Value Range Status  07/24/2011 31.6* 36.0-46.0 (%) Final  01/22/2011 34.8  34.8-46.6 (%) Final    Physical Exam:  General: alert, cooperative and no distress Lochia: appropriate Uterine Fundus: firm Incision: healing well DVT Evaluation: Negative Homan's sign. No significant calf/ankle edema.  Discharge Diagnoses: Term Pregnancy-delivered and gestational thrombocytopenia - stable  Discharge Information: Date: 07/25/2011 Activity: pelvic rest Diet: routine Medications: PNV and Ibuprofen Condition: stable Instructions: refer to practice specific booklet Discharge to: home Follow-up Information    Follow up with [facility] A[facility] MD in 6 weeks. (as scheduled)    Contact information:   [address] [place] [address] [phone]       Follow up with Levert Feinstein, MD in 2 weeks. (as scheduled)    Contact information:   [address] [place] [address] [phone]          Newborn Data: Live born female "Autumn Henderson" Newborn circumcised during hospital stay Birth Weight: 7 lb 15 oz (3600 g) APGAR: 5, 9  Home with mother.  Autumn Henderson 07/25/2011, 8:33 AM

## 2011-07-25 NOTE — Discharge Instructions (Signed)
Breast Pumping Tips Pumping your breast milk is a good way to stimulate milk production and have a steady supply of breast milk for your infant. Pumping is most helpful during your infant's growth spurts, when involving dad or a family member, or when you are away. There are several types of pumps available. They can be purchased at a baby or maternity store. You can begin pumping soon after delivery, but some experts believe that you should wait about four weeks to give your infant a bottle. In general, the more you breastfeed or pump, the more milk you will have for your infant. It is also important to take good care of yourself. This will reduce stress and help your body to create a healthy supply of milk. Your caregiver or lactation consultant can give you the information and support you need in your efforts to breastfeed your infant. PUMPING BREAST MILK  Follow the tips below for successful breast pumping. Take care of yourself.  Drink enough water or fluids to keep urine clear or pale yellow. You may notice a thirsty feeling while breastfeeding. This is because your body needs more water to make breast milk. Keep a large water bottle handy. Make healthy drink choices such as unsweetened fruit juice, milk and water. Limit soda, coffee, and alcohol (wait 2 hours to feed or pump if you have an alcoholic drink.)   Eat a healthy, well-balanced diet rich in fruits, vegetables, and whole grains.   Exercise as recommended by your caregiver.   Get plenty of sleep. Sleep when your infant sleeps. Ask friends and family for help if you need time to nap or rest.   Do not smoke. Smoking can lower your milk supply and harm your infant. If you need help quitting, ask your caregiver for a program recommendation.   Ask your caregiver about birth control options. Birth control pills may lower your milk supply. You may be advised to use condoms or other forms of birth control.  Relax and pump Stimulating your  let-down reflex is the key to successful and effective pumping. This makes the milk in all parts of the breast flow more freely.   It is easier to pump breast milk (and breastfeed) while you are relaxed. Find techniques that work for you. Quiet private spaces, breast massage, soothing heat placed on the breast, music, and pictures or a tape recording of your infant may help you to relax and "let down" your milk. If you have difficulty with your let down, try smelling one of your infant's blankets or an item of clothing he or she has worn while you are pumping.   When pumping, place the special suction cup (flange) directly over the nipple. It may be uncomfortable and cause nipple damage if it is not placed properly or is the wrong size. Applying a small amount of purified or modified lanolin to your nipple and the areola may help increase your comfort level. Also, you can change the speed and suction of many electric pumps to your comfort level. Your caregiver or lactation consultant can help you with this.   If pumping continues to be painful, or you feel you are not getting very much milk when you pump, you may need a different type of pump. A lactation consultant can help you determine if this is the case.   If you are with your infant, feed him or her on demand and try pumping after each feeding. This will boost your production, even if milk   does not come out. You may not be able to pump much milk at first, but keep up the routine, and this will change.   If you are working or away from your infant for several hours, try pumping for about 15 minutes every 2 to 3 hours. Pump both breasts at the same time if you can.   If your infant has a formula feeding, make sure you pump your milk around the same time to maintain your supply.   Begin pumping breast milk a few weeks before you return to work. This will help you develop techniques that work for you and will be able to store extra milk.   Find a  source of breastfeeding information that works well for you.  TIPS FOR STORING BREAST MILK  Store breast milk in a sealable sterile bag, jar, or container provided with your pumping supplies.   Store milk in small amounts close to what your infant is drinking at each feeding.   Cool pumped milk in a refrigerator or cooler. Pumped milk can last at the back of the refrigerator for 3 to 8 days.   Place cooled milk at the back of the freezer for up to 3 months.   Thaw the milk in its container or bag in warm water up to 24 hours in advance. Do not use a microwave to thaw or heat milk. Do not refreeze the milk after it has been thawed.   Breast milk is safe to drink when left at room temperature (mid 70s or colder) for 4 to 8 hours. After that, throw it away.   Milk fat can separate and look funny. The color can [place] slightly from day to day. This is normal. Always shake the milk before using it to mix the fat with the more watery portion.  SEEK MEDICAL CARE IF:   You are having trouble pumping or feeding your infant.   You are concerned that you are not making enough milk.   You have nipple pain, soreness, or redness.   You have other questions or concerns related to you or your infant.  Document Released: 10/02/2009 Document Revised: 04/03/2011 Document Reviewed: 10/02/2009 [facility] Patient Information 2012 [place], [place].Postpartum Depression and Baby Blues The postpartum period begins right after the birth of a baby. During this time, there is often a great amount of joy and excitement. It is also a time of considerable changes in the life of the parent(s). Regardless of how many times a mother gives birth, each child brings new challenges and dynamics to the family. It is not unusual to have feelings of excitement accompanied by confusing shifts in moods, emotions, and thoughts. All mothers are at risk of developing postpartum depression or the "baby blues." These mood changes can occur  right after giving birth, or they may occur many months after giving birth. The baby blues or postpartum depression can be mild or severe. Additionally, postpartum depression can resolve rather quickly, or it can be a long-term condition. CAUSES Elevated hormones and their rapid decline are thought to be a main cause of postpartum depression and the baby blues. There are a number of hormones that radically change during and after pregnancy. Estrogen and progesterone usually decrease immediately after delivering your baby. The level of thyroid hormone and various cortisol steroids also rapidly drop. Other factors that play a major role in these changes include major life events and genetics.  RISK FACTORS If you have any of the following risks for the  baby blues or postpartum depression, know what symptoms to watch out for during the postpartum period. Risk factors that may increase the likelihood of getting the baby blues or postpartum depression include:  Havinga personal or family history of depression.   Having depression while being pregnant.   Having premenstrual or oral contraceptive-associated mood issues.   Having exceptional life stress.   Having marital conflict.   Lacking a social support network.   Having a baby with special needs.   Having health problems such as diabetes.  SYMPTOMS Baby blues symptoms include:  Brief fluctuations in mood, such as going from extreme happiness to sadness.   Decreased concentration.   Difficulty sleeping.   Crying spells, tearfulness.   Irritability.   Anxiety.  Postpartum depression symptoms typically begin within the first month after giving birth. These symptoms include:  Difficulty sleeping or excessive sleepiness.   Marked weight loss.   Agitation.   Feelings of worthlessness.   Lack of interest in activity or food.  Postpartum psychosis is a very concerning condition and can be dangerous. Fortunately, it is rare.  Displaying any of the following symptoms is cause for immediate medical attention. Postpartum psychosis symptoms include:  Hallucinations and delusions.   Bizarre or disorganized behavior.   Confusion or disorientation.  DIAGNOSIS  A diagnosis is made by an evaluation of your symptoms. There are no medical or lab tests that lead to a diagnosis, but there are various questionnaires that a caregiver may use to identify those with the baby blues, postpartum depression, or psychosis. Often times, a screening tool called the Edinburgh Postnatal Depression Scale is used to diagnose depression in the postpartum period.  TREATMENT The baby blues usually goes away on its own in 1 to 2 weeks. Social support is often all that is needed. You should be encouraged to get adequate sleep and rest. Occasionally, you may be given medicines to help you sleep.  Postpartum depression requires treatment as it can last several months or longer if it is not treated. Treatment may include individual or group therapy, medicine, or both to address any social, physiological, and psychological factors that may play a role in the depression. Regular exercise, a healthy diet, rest, and social support may also be strongly recommended.  Postpartum psychosis is more serious and needs treatment right away. Hospitalization is often needed. HOME CARE INSTRUCTIONS  Get as much rest as you can. Nap when the baby sleeps.   Exercise regularly. Some women find yoga and walking to be beneficial.   Eat a balanced and nourishing diet.   Do little things that you enjoy. Have a cup of tea, take a bubble bath, read your favorite magazine, or listen to your favorite music.   Avoid alcohol.   Ask for help with household chores, cooking, grocery shopping, or running errands as needed. Do not try to do everything.   Talk to people close to you about how you are feeling. Get support from your partner, family members, friends, or other new  moms.   Try to stay positive in how you think. Think about the things you are grateful for.   Do not spend a lot of time alone.   Only take medicine as directed by your caregiver.   Keep all your postpartum appointments.   Let your caregiver know if you have any concerns.  SEEK MEDICAL CARE IF: You are having a reaction or problems with your medicine. SEEK IMMEDIATE MEDICAL CARE IF:  You have suicidal feelings.     You feel you may harm the baby or someone else.  Document Released: 01/17/2004 Document Revised: 04/03/2011 Document Reviewed: 02/18/2011 [facility] Patient Information 2012 [place], [place].Thrombocytopenia Thrombocytopenia is a condition in which there is an abnormally small number of platelets in your blood. Platelets are also called thrombocytes. Platelets are needed for blood clotting. CAUSES Thrombocytopenia is caused by:   Decreased production of platelets. This can be caused by:   Aplastic anemia in which your bone marrow quits making blood cells.   Cancer in the bone marrow.   Use of certain medicines, including chemotherapy.   Infection in the bone marrow.   Heavy alcohol consumption.   Increased destruction of platelets. This can be caused by:   Certain immune diseases.   Use of certain drugs.   Certain blood clotting disorders.   Certain inherited disorders.   Certain bleeding disorders.   Pregnancy.   Having an enlarged spleen (hypersplenism). In hypersplenism, the spleen gathers up platelets from circulation. This means the platelets are not available to help with blood clotting. The spleen can enlarge due to cirrhosis or other conditions.  SYMPTOMS  The symptoms of thrombocytopenia are side effects of poor blood clotting. Some of these are:  Abnormal bleeding.   Nosebleeds.   Heavy menstrual periods.   Blood in the urine or stools.   Purpura. This is a purplish discoloration in the skin produced by small bleeding vessels near the  surface of the skin.   Bruising.   A rash that may be petechial. This looks like pinpoint, purplish-red spots on the skin and mucous membranes. It is caused by bleeding from small blood vessels (capillaries).  DIAGNOSIS  Your caregiver will make this diagnosis based on your exam and blood tests. Sometimes, a bone marrow study is done to look for the original cells (megakaryocytes) that make platelets. TREATMENT  Treatment depends on the cause of the condition.  Medicines may be given to help protect your platelets from being destroyed.   In some cases, a replacement (transfusion) of platelets may be required to stop or prevent bleeding.   Sometimes, the spleen must be surgically removed.  HOME CARE INSTRUCTIONS   Check the skin and linings inside your mouth for bruising or bleeding as directed by your caregiver.   Check your sputum, urine, and stool for blood as directed by your caregiver.   Do not return to any activities that could cause bumps or bruises until your caregiver says it is okay.   Take extra care not to cut yourself when shaving or when using scissors, needles, knives, and other tools.   Take extra care not to burn yourself when ironing or cooking.   Ask your caregiver if it is okay for you to drink alcohol.   Only take over-the-counter or prescription medicines as directed by your caregiver.   Notify all your caregivers, including dentists and eye doctors, about your condition.  SEEK IMMEDIATE MEDICAL CARE IF:   You develop active bleeding from anywhere in your body.   You develop unexplained bruising or bleeding.   You have blood in your sputum, urine, or stool.  MAKE SURE YOU:  Understand these instructions.   Will watch your condition.   Will get help right away if you are not doing well or get worse.  Document Released: 04/14/2005 Document Revised: 04/03/2011 Document Reviewed: 02/14/2011 [facility] Patient Information 2012 [place], [place].

## 2011-07-25 NOTE — Progress Notes (Addendum)
PPD 2 SVD  S:  Reports feeling well.             Tolerating po/ No nausea or vomiting             Bleeding is light at present, had some small clots last night.             Pain controlled with Motrin.             Up ad lib / ambulatory  Newborn  Information for the patient's newborn:  Gari, Trovato [[id number]]  female   breats feeding, no concerns  / Circumcision done   O:  A & O x 3 NAD             VS: Blood pressure 98/58, pulse 56, temperature 98 F (36.7 C), temperature source Oral, resp. rate 18, height 5\' 8"  (1.727 m), weight 64.864 kg (143 lb), last menstrual period 10/10/2010, SpO2 99.00%, unknown if currently breastfeeding.  LABS:   Basename 07/25/11 0525 07/24/11 0530 07/23/11 0800  WBC -- 10.7* 7.3  HGB -- 10.7* 12.1  HCT -- 31.6* 35.1*  PLT 107* 108* --     Abdomen: soft, non-tender, non-distended              Fundus: firm, non-tender, U-2  Perineum: intact repair, mild edema, ice in place  Lochia: scant  Extremities: no edema, no calf pain or tenderness, neg Homans    A/P: PPD # 2 31 y.o., [id number]    Principal Problem:  *PP care - s/p NVB 3/27  Doing well - stable status  Routine post partum orders  Active Problems:   Thrombocytopenia, no hemorrhage  Plts stable this am  Plan f/u w/ heme ([facility]) in 2 weeks  Bleeding precautions reviewed     DC home with WOB instructions.   Arlan Organ CNM, MSN 07/25/2011, 8:25 AM

## 2011-07-27 LAB — PLATELET COUNT: Platelets: 107 10*3/uL — ABNORMAL LOW (ref 150–400)

## 2011-08-18 ENCOUNTER — Ambulatory Visit (HOSPITAL_BASED_OUTPATIENT_CLINIC_OR_DEPARTMENT_OTHER): Payer: BC Managed Care – PPO | Admitting: Oncology

## 2011-08-18 ENCOUNTER — Telehealth: Payer: Self-pay | Admitting: Oncology

## 2011-08-18 ENCOUNTER — Ambulatory Visit (HOSPITAL_BASED_OUTPATIENT_CLINIC_OR_DEPARTMENT_OTHER): Payer: BC Managed Care – PPO | Admitting: Lab

## 2011-08-18 VITALS — BP 108/72 | HR 63 | Temp 96.9°F | Ht 68.0 in | Wt 130.3 lb

## 2011-08-18 DIAGNOSIS — D696 Thrombocytopenia, unspecified: Secondary | ICD-10-CM

## 2011-08-18 LAB — CBC WITH DIFFERENTIAL/PLATELET
BASO%: 0.4 % (ref 0.0–2.0)
Basophils Absolute: 0 10*3/uL (ref 0.0–0.1)
HCT: 37.6 % (ref 34.8–46.6)
HGB: 12.6 g/dL (ref 11.6–15.9)
LYMPH%: 47.2 % (ref 14.0–49.7)
MCHC: 33.6 g/dL (ref 31.5–36.0)
MONO#: 0.3 10*3/uL (ref 0.1–0.9)
NEUT%: 45.4 % (ref 38.4–76.8)
Platelets: 143 10*3/uL — ABNORMAL LOW (ref 145–400)
WBC: 4.7 10*3/uL (ref 3.9–10.3)
lymph#: 2.2 10*3/uL (ref 0.9–3.3)

## 2011-08-18 LAB — MORPHOLOGY: PLT EST: ADEQUATE

## 2011-08-18 LAB — CHCC SMEAR

## 2011-08-18 NOTE — Progress Notes (Signed)
Hematology and Oncology Follow Up Visit  Autumn Henderson [id number] 1981/01/23 30 y.o. 08/18/2011 11:53 AM   Principle Diagnosis: Encounter Diagnosis  Name Primary?  . Thrombocytopenia Yes     Interim History:   Followup visit for this pleasant 31 year old woman I initially saw in this office at the time of her second pregnancy back in September 2012. There is a history of menorrhagia in her maternal grandmother and her mother both of whom had to have hysterectomies at an early age. Neither one had a formal hematology evaluation. Camira also gives a history of excessive bleeding and bruising dating as far back as a tonsillectomy at age 78. She has heavy menstrual periods. She had increased bleeding after wisdom teeth extractions at age 4. She had increased bleeding postpartum with her first child but did sustain a vaginal laceration. She reports aspirin sensitivity. Previous laboratory studies showed moderate thrombocytopenia with platelet counts in the range of 129,000-159,000. PTT recorded in our office back in September was 32 seconds with lab normal up to 37 seconds, hemoglobin 12, and platelets 143,000. Recent platelet counts have a dipped lower than previous baseline with a platelet count of 108,000 recorded on 07/24/2011. These results were obtained 9 days prior to induction of labor at 40 weeks and 6 days. Due to the difficulty in diagnosing von Willebrand's disorder during pregnancy, I recommended having DDAVP available at time of delivery. Fortunately she had no complications with delivery of her second child and did not require any medication. Both of her children are doing well. She has had no interim medical problems.  Medications: reviewed  Allergies: No Known Allergies  Review of Systems: Unremarkable.   Physical Exam: Blood pressure 108/72, pulse 63, temperature 96.9 F (36.1 C), temperature source Oral, height 5\' 8"  (1.727 m), weight 130 lb 4.8 oz (59.104 kg), unknown if  currently breastfeeding.   Lab Results: Lab Results  Component Value Date   WBC 10.7* 07/24/2011   HGB 10.7* 07/24/2011   HCT 31.6* 07/24/2011   MCV 91.9 07/24/2011   PLT 107* 07/25/2011     Chemistry             Impression and Plan: Von Willebrand's disorder versus chronic ITP Now that she is 1 month postpartum we should be able to get reliable results on her von Willebrand's profile which I will obtain today. I will call patient and advise her with the results when they are available.     CC:. Dr. Lisbeth Renshaw; [facility]   Levert Feinstein, MD 4/22/201311:53 AM

## 2011-08-18 NOTE — Telephone Encounter (Signed)
Pt sent back to the lab   aom °

## 2011-08-20 LAB — VON WILLEBRAND PANEL
Coagulation Factor VIII: 105 % (ref 73–140)
Von Willebrand Antigen, Plasma: 83 % (ref 50–217)

## 2011-08-21 ENCOUNTER — Telehealth: Payer: Self-pay

## 2011-08-21 NOTE — Telephone Encounter (Signed)
Multiple attempts to contact pt re: Dr Patsy Lager note.  Line busy.  Will continue to attempt. dph

## 2011-08-21 NOTE — Telephone Encounter (Signed)
Message copied by Albertha Ghee on Thu Aug 21, 2011  2:31 PM ------      Message from: Levert Feinstein      Created: Wed Aug 20, 2011  6:40 PM       Call pt Von Willebrand panel normal

## 2011-08-27 ENCOUNTER — Telehealth: Payer: Self-pay | Admitting: *Deleted

## 2011-08-27 NOTE — Telephone Encounter (Signed)
Notified pt of von wilebrand result per Dr. Cyndie Chime.

## 2011-08-27 NOTE — Telephone Encounter (Signed)
Message copied by Sabino Snipes on Wed Aug 27, 2011  3:35 PM ------      Message from: Levert Feinstein      Created: Wed Aug 20, 2011  6:40 PM       Call pt Von Willebrand panel normal

## 2012-05-25 IMAGING — US US OB DETAIL+14 WK
1 series · 14 of 28 positions shown · non-contrast
Comparison: none

[Series 1: us ob detail+14 wk · 0.17mm/px · 14 of 83 slices shown]
[im 4/83]
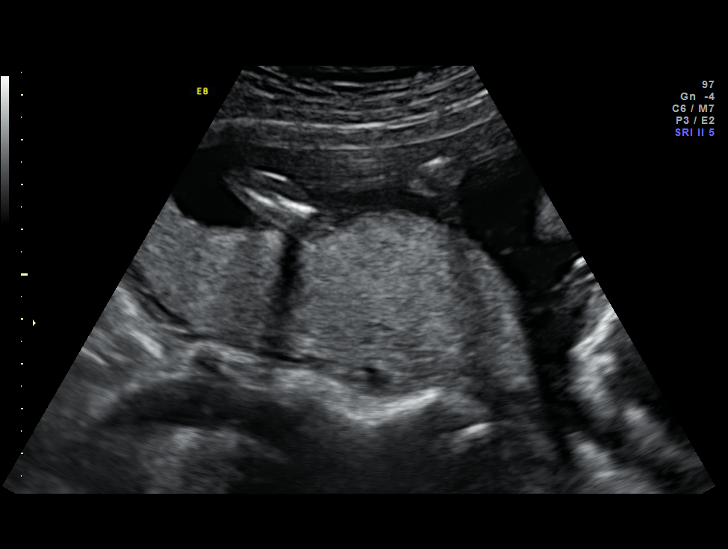
[im 10/83]
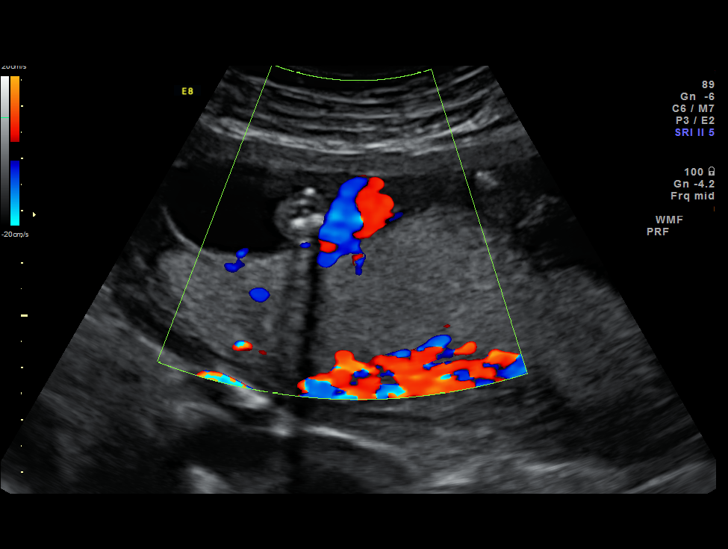
[im 16/83]
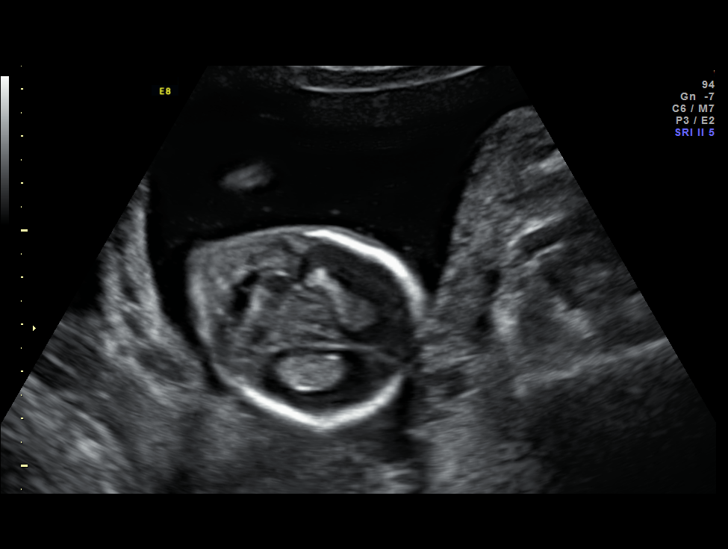
[im 22/83]
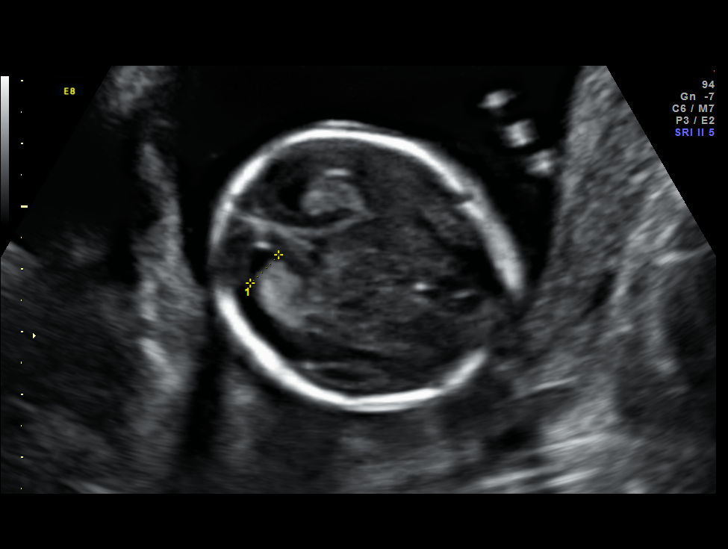
[im 28/83]
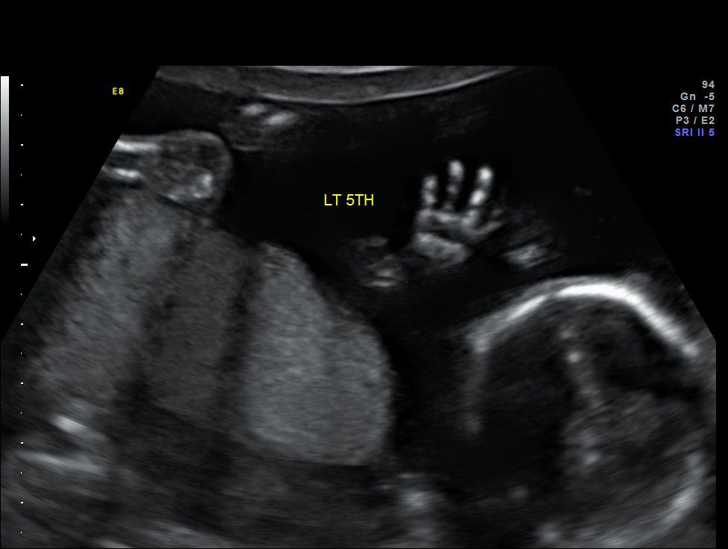
[im 34/83]
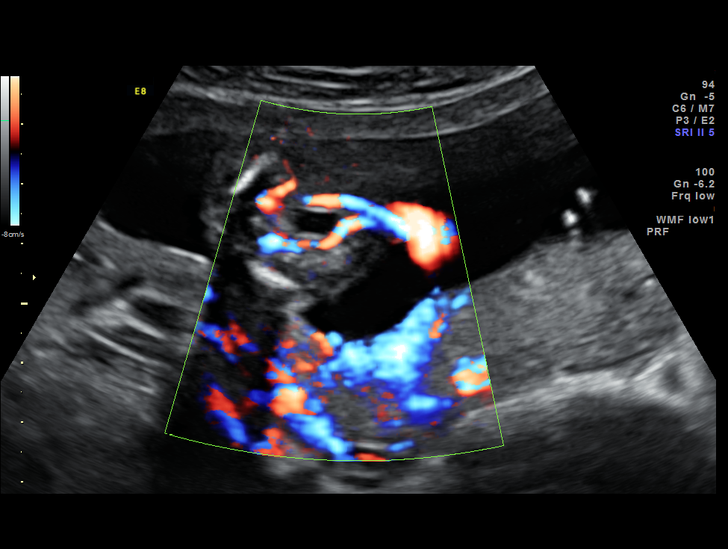
[im 40/83]
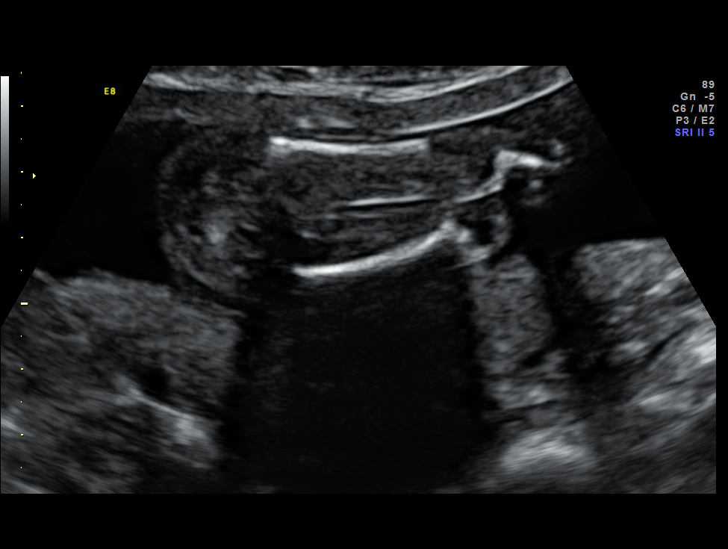
[im 46/83]
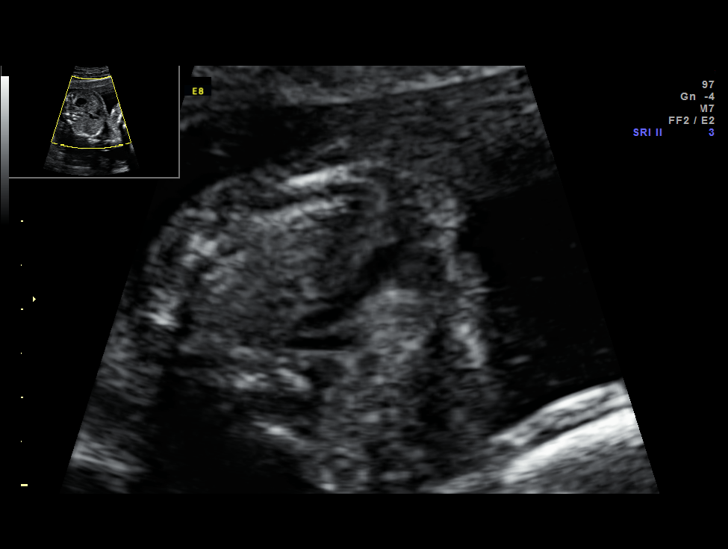
[im 52/83]
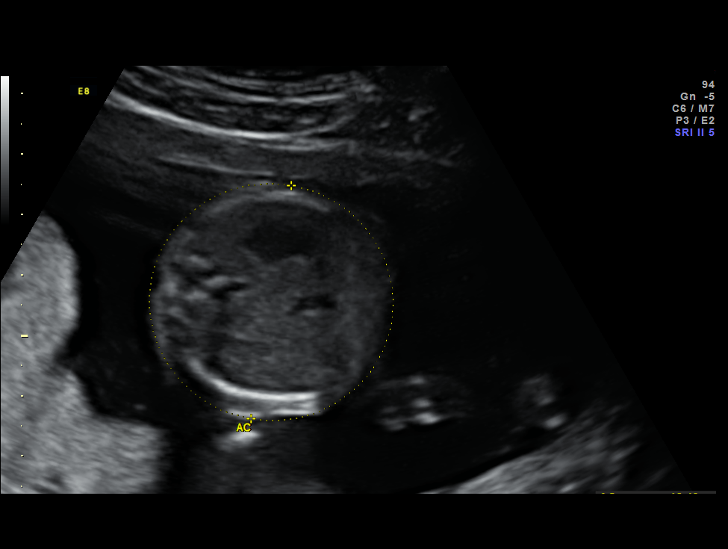
[im 58/83]
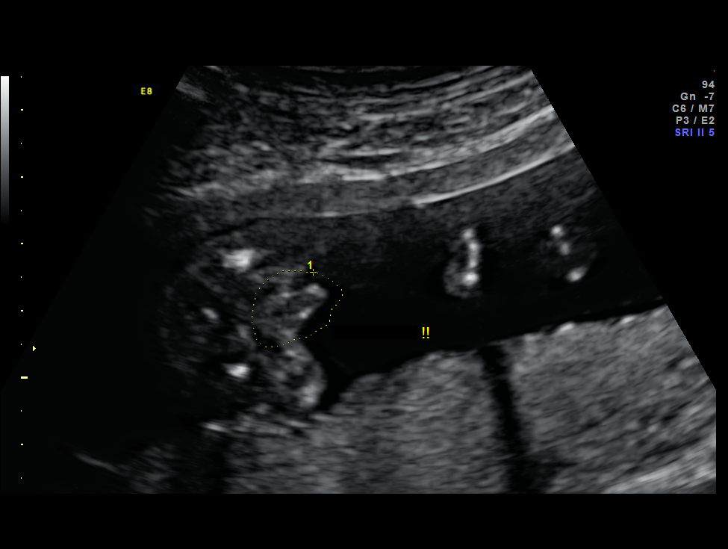
[im 64/83]
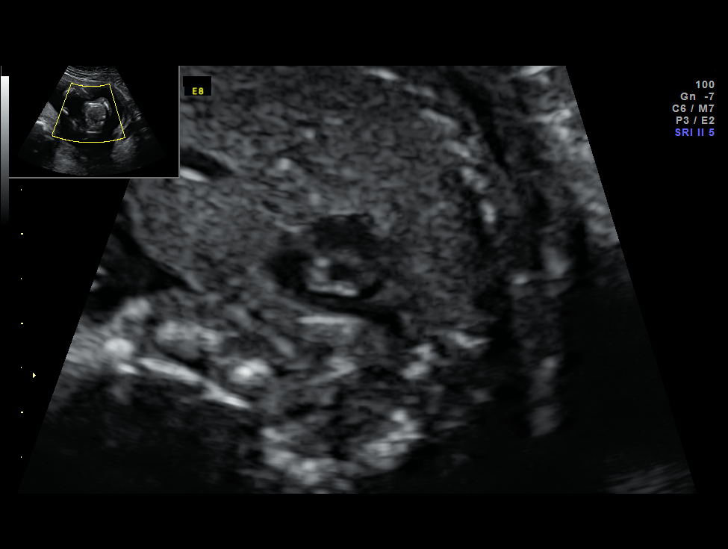
[im 70/83]
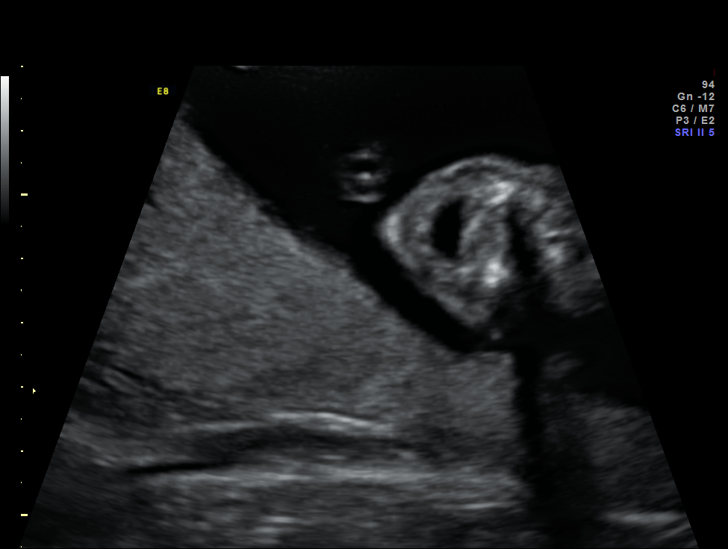
[im 76/83]
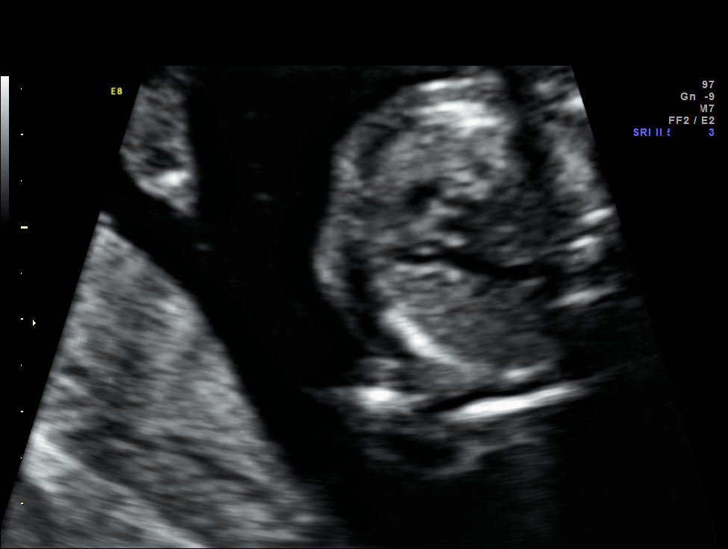
[im 83/83]
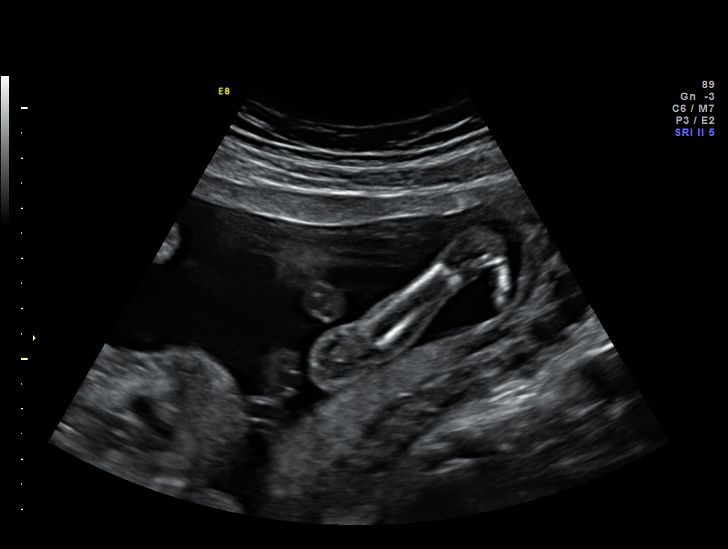

[14 of 28 positions shown; findings below may reference images not displayed]

Canned report from images found in remote index.

Refer to host system for actual result text.

## 2014-02-27 ENCOUNTER — Encounter (HOSPITAL_COMMUNITY): Payer: Self-pay

## 2014-11-14 ENCOUNTER — Ambulatory Visit
Admission: RE | Admit: 2014-11-14 | Discharge: 2014-11-14 | Disposition: A | Discharge status: Home / Self Care | Payer: BLUE CROSS/BLUE SHIELD | Source: Ambulatory Visit | Attending: Cardiology | Admitting: Cardiology

## 2014-11-14 ENCOUNTER — Other Ambulatory Visit: Payer: Self-pay | Admitting: Cardiology

## 2014-11-14 DIAGNOSIS — R0602 Shortness of breath: Secondary | ICD-10-CM

## 2019-07-10 NOTE — Progress Notes (Signed)
[facility]    Provider:  Dr Jaynee Eagles Requesting Provider: Leigh Aurora, MD Primary Care Provider:  Aloha Gell, MD  CC:  Headaches, diagnosed with migraines in her 60s  HPI:  Autumn Henderson is a 39 y.o. female here as requested by Hennie Duos, MD for migraines.  Past medical history migraines, systemic lupus diagnosed in 2015, Sjogren's disease, Raynaud's, other headache syndrome, midline low back pain without sciatica, hypoglycemia.  I reviewed Dr. Melissa Noon notes, she is on Benlysta since May 2016, less fatigue and joint pain overall, she has episodic headaches and nausea with a known history of migraines.  She had a severe headache 2 weeks prior to her last appointment which was June 06, 2019, it resolved, she has daily dull headaches that are mild, also increased low back pain.  Labs reviewed  all normal CBC, CMP, ESR, CRP.  More frequent in the last few months. Diagnosed with migraines in her 35s. She had a severe headache, it was so bad she felt she was going to pass out, was severe, acute, worst headache of her life. She has a lot of joint pain and this can affect her migraines. Starts in the back of the head and radiates forward, can be unilateral, radiates all over the head and neck, behind the eyes, pounding/pounding/pulsating, light sensitivity, sound sensitivity, nausea, she has vomited in the past. She has 15 headache days, 8 migraine days that are moderately severe or severe, they can last 24-72 hours, no medication overuse. She also has associated dizziness and confusion. She has vision changes, blurry vision. No aura. If she bends over it is worse, positional, she can't do yoga anymore because she can't bend over, she is only walking. She can wake up with the headaches. She has fatigue. Also "nerve pain" tingling in the fingers, she woke up one day without feeling in the ankle on the left which resolved. She tried imitrex and topamax, amitriptyline in the  past.   Reviewed notes, labs and imaging from outside physicians, which showed: see above  meds tried: tylenol, benadryl, ibuprofen, zofran,   Review of Systems: Patient complains of symptoms per HPI as well as the following symptoms: headaches, lupus, fatigue, forgetfulness, insomnia, feeling cold, aching muscles. Pertinent negatives and positives per HPI. All others negative.   Social History   Socioeconomic History  . Marital status: Married    Spouse name: Not on file  . Number of children: 2  . Years of education: Not on file  . Highest education level: Bachelor's degree (e.g., BA, AB, BS)  Occupational History  . Not on file  Tobacco Use  . Smoking status: Never Smoker  . Smokeless tobacco: Never Used  Substance and Sexual Activity  . Alcohol use: Yes    Alcohol/week: 1.0 standard drinks    Types: 1 Standard drinks or equivalent per week  . Drug use: No  . Sexual activity: Yes    Birth control/protection: I.U.D.  Other Topics Concern  . Not on file  Social History Narrative   Lives at home with husband Company secretary) and children   Left handed   Caffeine: 8 oz per day   Social Determinants of Health   Financial Resource Strain:   . Difficulty of Paying Living Expenses:   Food Insecurity:   . Worried About Charity fundraiser in the Last Year:   . Arboriculturist in the Last Year:   Transportation Needs:   . Film/video editor (Medical):   Marland Kitchen  Lack of Transportation (Non-Medical):   Physical Activity:   . Days of Exercise per Week:   . Minutes of Exercise per Session:   Stress:   . Feeling of Stress :   Social Connections:   . Frequency of Communication with Friends and Family:   . Frequency of Social Gatherings with Friends and Family:   . Attends Religious Services:   . Active Member of Clubs or Organizations:   . Attends Archivist Meetings:   Marland Kitchen Marital Status:   Intimate Partner Violence:   . Fear of Current or Ex-Partner:   . Emotionally  Abused:   Marland Kitchen Physically Abused:   . Sexually Abused:     Family History  Problem Relation Age of Onset  . Birth defects Mother        cleft palate   . Osteoarthritis Mother   . Sjogren's syndrome Mother   . Birth defects Sister        twin w/ internal cleft palate  . Lupus Sister   . Stroke Maternal Grandfather   . Stroke Paternal Grandfather   . Rheum arthritis Paternal Grandfather   . Asthma Father   . Heart Problems Maternal Grandmother     Past Medical History:  Diagnosis Date  . Acute maxillary sinusitis   . Asthma   . Headache(784.0)   . History of chicken pox   . Migraines   . Other current maternal conditions classifiable elsewhere, complicating pregnancy, childbirth, or the puerperium, unspecified as to episode of care    glucosuria  . Other diseases of respiratory system, not elsewhere classified   . PP care - s/p NVB 3/27 07/24/2011  . Shingles   . Sjogren's syndrome ([place])   . Systemic lupus erythematosus ([place])   . Thrombocytopenia ([place]) 07/24/2011  . Thrombocytopenia complicating pregnancy ([place])   . Von Willebrand disease [facility])     Patient Active Problem List   Diagnosis Date Noted  . Thrombocytopenia ([place]) 07/24/2011    Past Surgical History:  Procedure Laterality Date  . HAND SURGERY  1999   L pinkie  . TONSILLECTOMY     as child had bleeding problems and long healin  . WISDOM TOOTH EXTRACTION  2000    Current Outpatient Medications  Medication Sig Dispense Refill  . Belimumab (BENLYSTA IV) Inject into the vein. Every 4 weeks.    Marland Kitchen levonorgestrel (MIRENA, 52 MG,) 20 MCG/24HR IUD 1 each by Intrauterine route once.    . Prenatal Vit-Fe Fumarate-FA (PRENATAL MULTIVITAMIN) TABS Take 1 tablet by mouth every morning.    . Fremanezumab-vfrm (AJOVY) 225 MG/1.5ML SOAJ Inject 225 mg into the skin every 30 (thirty) days. 1 pen 11  . ondansetron (ZOFRAN-ODT) 4 MG disintegrating tablet Take 1 tablet (4 mg total) by mouth every 8 (eight) hours as needed for  nausea. 30 tablet 3  . rizatriptan (MAXALT-MLT) 10 MG disintegrating tablet Take 1 tablet (10 mg total) by mouth as needed for migraine. May repeat in 2 hours if needed 9 tablet 11   No current facility-administered medications for this visit.    Allergies as of 07/11/2019 - Review Complete 07/11/2019  Allergen Reaction Noted  . Other  07/11/2019  . Plaquenil [hydroxychloroquine] Nausea And Vomiting 07/11/2019    Vitals: BP 125/71 (BP Location: Right Arm, Patient Position: Sitting)   Pulse 90   Temp (!) 97.2 F (36.2 C) Comment: taken at front  Ht 5' 7.5" (1.715 m)   Wt 134 lb (60.8 kg)   Breastfeeding No  BMI 20.68 kg/m  Last Weight:  Wt Readings from Last 1 Encounters:  07/11/19 134 lb (60.8 kg)   Last Height:   Ht Readings from Last 1 Encounters:  07/11/19 5' 7.5" (1.715 m)     Physical exam: Exam: Gen: NAD, conversant, well nourised, obese, well groomed                     CV: RRR, no MRG. No Carotid Bruits. No peripheral edema, warm, nontender Eyes: Conjunctivae clear without exudates or hemorrhage  Neuro: Detailed Neurologic Exam  Speech:    Speech is normal; fluent and spontaneous with normal comprehension.  Cognition:    The patient is oriented to person, place, and time;     recent and remote memory intact;     language fluent;     normal attention, concentration,     fund of knowledge Cranial Nerves:    The pupils are equal, round, and reactive to light. The fundi are normal and spontaneous venous pulsations are present. Visual fields are full to finger confrontation. Extraocular movements are intact. Trigeminal sensation is intact and the muscles of mastication are normal. The face is symmetric. The palate elevates in the midline. Hearing intact. Voice is normal. Shoulder shrug is normal. The tongue has normal motion without fasciculations.   Coordination:    Normal finger to nose and heel to shin. Normal rapid alternating movements.   Gait:     Heel-toe and tandem gait are normal.   Motor Observation:    No asymmetry, no atrophy, and no involuntary movements noted. Tone:    Normal muscle tone.    Posture:    Posture is normal. normal erect    Strength:    Strength is V/V in the upper and lower limbs.      Sensation: intact to LT     Reflex Exam:  DTR's:    Deep tendon reflexes in the upper and lower extremities are normal bilaterally.   Toes:    The toes are downgoing bilaterally.   Clonus:    Clonus is absent.    Assessment/Plan:    MRI brain: MRI brain due to concerning symptoms of morning headaches, positional headaches,vision changes  to look for space occupying mass, chiari or intracranial hypertension (pseudotumor).   Orders Placed This Encounter  Procedures  . MR BRAIN W WO CONTRAST   Meds ordered this encounter  Medications  . Fremanezumab-vfrm (AJOVY) 225 MG/1.5ML SOAJ    Sig: Inject 225 mg into the skin every 30 (thirty) days.    Dispense:  1 pen    Refill:  11  . rizatriptan (MAXALT-MLT) 10 MG disintegrating tablet    Sig: Take 1 tablet (10 mg total) by mouth as needed for migraine. May repeat in 2 hours if needed    Dispense:  9 tablet    Refill:  11  . ondansetron (ZOFRAN-ODT) 4 MG disintegrating tablet    Sig: Take 1 tablet (4 mg total) by mouth every 8 (eight) hours as needed for nausea.    Dispense:  30 tablet    Refill:  3    Cc: Hennie Duos, MD,  Aloha Gell, MD  Sarina Ill, MD  [facility] [address] [place], [place] [address]  Phone [phone] Fax [phone]

## 2019-07-11 ENCOUNTER — Ambulatory Visit (INDEPENDENT_AMBULATORY_CARE_PROVIDER_SITE_OTHER): Payer: 59 | Admitting: Neurology

## 2019-07-11 ENCOUNTER — Telehealth: Payer: Self-pay | Admitting: Neurology

## 2019-07-11 ENCOUNTER — Other Ambulatory Visit: Payer: Self-pay

## 2019-07-11 ENCOUNTER — Encounter: Payer: Self-pay | Admitting: Neurology

## 2019-07-11 VITALS — BP 125/71 | HR 90 | Temp 97.2°F | Ht 67.5 in | Wt 134.0 lb

## 2019-07-11 DIAGNOSIS — R51 Headache with orthostatic component, not elsewhere classified: Secondary | ICD-10-CM

## 2019-07-11 DIAGNOSIS — R42 Dizziness and giddiness: Secondary | ICD-10-CM

## 2019-07-11 DIAGNOSIS — H538 Other visual disturbances: Secondary | ICD-10-CM

## 2019-07-11 DIAGNOSIS — G43709 Chronic migraine without aura, not intractable, without status migrainosus: Secondary | ICD-10-CM

## 2019-07-11 DIAGNOSIS — R519 Headache, unspecified: Secondary | ICD-10-CM

## 2019-07-11 MED ORDER — AJOVY 225 MG/1.5ML ~~LOC~~ SOAJ: 225.0000 mg | SUBCUTANEOUS | 11 refills | Status: DC

## 2019-07-11 MED ORDER — RIZATRIPTAN BENZOATE 10 MG PO TBDP: 10.0000 mg | ORAL_TABLET | ORAL | 11 refills | Status: DC | PRN

## 2019-07-11 MED ORDER — ONDANSETRON 4 MG PO TBDP: 4.0000 mg | ORAL_TABLET | Freq: Three times a day (TID) | ORAL | 3 refills | Status: DC | PRN

## 2019-07-11 NOTE — Patient Instructions (Signed)
[place]: Rizatriptan: Please take one tablet at the onset of your headache. If it does not improve the symptoms please take one additional tablet. Do not take more then 2 tablets in 24hrs. Do not take use more then 2 to 3 times in a week. Ondansetron: for nausea, may also take with rizatriptan Ajovy preventative monthly MRI of the brin  Fremanezumab injection What is this medicine? FREMANEZUMAB (fre ma NEZ ue mab) is used to prevent migraine headaches. This medicine may be used for other purposes; ask your health care provider or pharmacist if you have questions. COMMON BRAND NAME(S): AJOVY What should I tell my health care provider before I take this medicine? They need to know if you have any of these conditions:  an unusual or allergic reaction to fremanezumab, other medicines, foods, dyes, or preservatives  pregnant or trying to get pregnant  breast-feeding How should I use this medicine? This medicine is for injection under the skin. You will be taught how to prepare and give this medicine. Use exactly as directed. Take your medicine at regular intervals. Do not take your medicine more often than directed. It is important that you put your used needles and syringes in a special sharps container. Do not put them in a trash can. If you do not have a sharps container, call your pharmacist or healthcare provider to get one. Talk to your pediatrician regarding the use of this medicine in children. Special care may be needed. Overdosage: If you think you have taken too much of this medicine contact a poison control center or emergency room at once. NOTE: This medicine is only for you. Do not share this medicine with others. What if I miss a dose? If you miss a dose, take it as soon as you can. If it is almost time for your next dose, take only that dose. Do not take double or extra doses. What may interact with this medicine? Interactions are not expected. This list may not describe all  possible interactions. Give your health care provider a list of all the medicines, herbs, non-prescription drugs, or dietary supplements you use. Also tell them if you smoke, drink alcohol, or use illegal drugs. Some items may interact with your medicine. What should I watch for while using this medicine? Tell your doctor or healthcare professional if your symptoms do not start to get better or if they get worse. What side effects may I notice from receiving this medicine? Side effects that you should report to your doctor or health care professional as soon as possible:  allergic reactions like skin rash, itching or hives, swelling of the face, lips, or tongue Side effects that usually do not require medical attention (report these to your doctor or health care professional if they continue or are bothersome):  pain, redness, or irritation at site where injected This list may not describe all possible side effects. Call your doctor for medical advice about side effects. You may report side effects to FDA at 1-800-FDA-1088. Where should I keep my medicine? Keep out of the reach of children. You will be instructed on how to store this medicine. Throw away any unused medicine after the expiration date on the label. NOTE: This sheet is a summary. It may not cover all possible information. If you have questions about this medicine, talk to your doctor, pharmacist, or health care provider.  2020 Elsevier/Gold Standard (2017-01-12 17:22:56) Ondansetron oral dissolving tablet What is this medicine? ONDANSETRON (on DAN se tron) is used  to treat nausea and vomiting caused by chemotherapy. It is also used to prevent or treat nausea and vomiting after surgery. This medicine may be used for other purposes; ask your health care provider or pharmacist if you have questions. COMMON BRAND NAME(S): Zofran ODT What should I tell my health care provider before I take this medicine? They need to know if you have  any of these conditions:  heart disease  history of irregular heartbeat  liver disease  low levels of magnesium or potassium in the blood  an unusual or allergic reaction to ondansetron, granisetron, other medicines, foods, dyes, or preservatives  pregnant or trying to get pregnant  breast-feeding How should I use this medicine? These tablets are made to dissolve in the mouth. Do not try to push the tablet through the foil backing. With dry hands, peel away the foil backing and gently remove the tablet. Place the tablet in the mouth and allow it to dissolve, then swallow. While you may take these tablets with water, it is not necessary to do so. Talk to your pediatrician regarding the use of this medicine in children. Special care may be needed. Overdosage: If you think you have taken too much of this medicine contact a poison control center or emergency room at once. NOTE: This medicine is only for you. Do not share this medicine with others. What if I miss a dose? If you miss a dose, take it as soon as you can. If it is almost time for your next dose, take only that dose. Do not take double or extra doses. What may interact with this medicine? Do not take this medicine with any of the following medications:  apomorphine  certain medicines for fungal infections like fluconazole, itraconazole, ketoconazole, posaconazole, voriconazole  cisapride  dronedarone  pimozide  thioridazine This medicine may also interact with the following medications:  carbamazepine  certain medicines for depression, anxiety, or psychotic disturbances  fentanyl  linezolid  MAOIs like Carbex, Eldepryl, Marplan, Nardil, and Parnate  methylene blue (injected into a vein)  other medicines that prolong the QT interval (cause an abnormal heart rhythm) like dofetilide, ziprasidone  phenytoin  rifampicin  tramadol This list may not describe all possible interactions. Give your health care  provider a list of all the medicines, herbs, non-prescription drugs, or dietary supplements you use. Also tell them if you smoke, drink alcohol, or use illegal drugs. Some items may interact with your medicine. What should I watch for while using this medicine? Check with your doctor or health care professional as soon as you can if you have any sign of an allergic reaction. What side effects may I notice from receiving this medicine? Side effects that you should report to your doctor or health care professional as soon as possible:  allergic reactions like skin rash, itching or hives, swelling of the face, lips, or tongue  breathing problems  confusion  dizziness  fast or irregular heartbeat  feeling faint or lightheaded, falls  fever and chills  loss of balance or coordination  seizures  sweating  swelling of the hands and feet  tightness in the chest  tremors  unusually weak or tired Side effects that usually do not require medical attention (report to your doctor or health care professional if they continue or are bothersome):  constipation or diarrhea  headache This list may not describe all possible side effects. Call your doctor for medical advice about side effects. You may report side effects to FDA  at 1-800-FDA-1088. Where should I keep my medicine? Keep out of the reach of children. Store between 2 and 30 degrees C (36 and 86 degrees F). Throw away any unused medicine after the expiration date. NOTE: This sheet is a summary. It may not cover all possible information. If you have questions about this medicine, talk to your doctor, pharmacist, or health care provider.  2020 Elsevier/Gold Standard (2018-04-06 07:14:10) Rizatriptan disintegrating tablets What is this medicine? RIZATRIPTAN (rye za TRIP tan) is used to treat migraines with or without aura. An aura is a strange feeling or visual disturbance that warns you of an attack. It is not used to prevent  migraines. This medicine may be used for other purposes; ask your health care provider or pharmacist if you have questions. COMMON BRAND NAME(S): Maxalt-MLT What should I tell my health care provider before I take this medicine? They need to know if you have any of these conditions:  cigarette smoker  circulation problems in fingers and toes  diabetes  heart disease  high blood pressure  high cholesterol  history of irregular heartbeat  history of stroke  kidney disease  liver disease  stomach or intestine problems  an unusual or allergic reaction to rizatriptan, other medicines, foods, dyes, or preservatives  pregnant or trying to get pregnant  breast-feeding How should I use this medicine? Take this medicine by mouth. Follow the directions on the prescription label. Leave the tablet in the sealed blister pack until you are ready to take it. With dry hands, open the blister and gently remove the tablet. If the tablet breaks or crumbles, throw it away and take a new tablet out of the blister pack. Place the tablet in the mouth and allow it to dissolve, and then swallow. Do not cut, crush, or chew this medicine. You do not need water to take this medicine. Do not take it more often than directed. Talk to your pediatrician regarding the use of this medicine in children. While this drug may be prescribed for children as young as 6 years for selected conditions, precautions do apply. Overdosage: If you think you have taken too much of this medicine contact a poison control center or emergency room at once. NOTE: This medicine is only for you. Do not share this medicine with others. What if I miss a dose? This does not apply. This medicine is not for regular use. What may interact with this medicine? Do not take this medicine with any of the following medicines:  certain medicines for migraine headache like almotriptan, eletriptan, frovatriptan, naratriptan, rizatriptan,  sumatriptan, zolmitriptan  ergot alkaloids like dihydroergotamine, ergonovine, ergotamine, methylergonovine  MAOIs like Carbex, Eldepryl, Marplan, Nardil, and Parnate This medicine may also interact with the following medications:  certain medicines for depression, anxiety, or psychotic disorders  propranolol This list may not describe all possible interactions. Give your health care provider a list of all the medicines, herbs, non-prescription drugs, or dietary supplements you use. Also tell them if you smoke, drink alcohol, or use illegal drugs. Some items may interact with your medicine. What should I watch for while using this medicine? Visit your healthcare professional for regular checks on your progress. Tell your healthcare professional if your symptoms do not start to get better or if they get worse. You may get drowsy or dizzy. Do not drive, use machinery, or do anything that needs mental alertness until you know how this medicine affects you. Do not stand up or sit up quickly, especially  if you are an older patient. This reduces the risk of dizzy or fainting spells. Alcohol may interfere with the effect of this medicine. Your mouth may get dry. Chewing sugarless gum or sucking hard candy and drinking plenty of water may help. Contact your healthcare professional if the problem does not go away or is severe. If you take migraine medicines for 10 or more days a month, your migraines may get worse. Keep a diary of headache days and medicine use. Contact your healthcare professional if your migraine attacks occur more frequently. What side effects may I notice from receiving this medicine? Side effects that you should report to your doctor or health care professional as soon as possible:  allergic reactions like skin rash, itching or hives, swelling of the face, lips, or tongue  chest pain or chest tightness  signs and symptoms of a dangerous change in heartbeat or heart rhythm like  chest pain; dizziness; fast, irregular heartbeat; palpitations; feeling faint or lightheaded; falls; breathing problems  signs and symptoms of a stroke like changes in vision; confusion; trouble speaking or understanding; severe headaches; sudden numbness or weakness of the face, arm or leg; trouble walking; dizziness; loss of balance or coordination  signs and symptoms of serotonin syndrome like irritable; confusion; diarrhea; fast or irregular heartbeat; muscle twitching; stiff muscles; trouble walking; sweating; high fever; seizures; chills; vomiting Side effects that usually do not require medical attention (report to your doctor or health care professional if they continue or are bothersome):  diarrhea  dizziness  drowsiness  dry mouth  headache  nausea, vomiting  pain, tingling, numbness in the hands or feet  stomach pain This list may not describe all possible side effects. Call your doctor for medical advice about side effects. You may report side effects to FDA at 1-800-FDA-1088. Where should I keep my medicine? Keep out of the reach of children. Store at room temperature between 15 and 30 degrees C (59 and 86 degrees F). Protect from light and moisture. Throw away any unused medicine after the expiration date. NOTE: This sheet is a summary. It may not cover all possible information. If you have questions about this medicine, talk to your doctor, pharmacist, or health care provider.  2020 Elsevier/Gold Standard (2017-10-27 14:58:08)

## 2019-07-11 NOTE — Telephone Encounter (Signed)
[employer] order sent to [facility]. They will reach out to the patient and obtain the auth.

## 2019-07-13 ENCOUNTER — Telehealth: Payer: Self-pay | Admitting: *Deleted

## 2019-07-13 NOTE — Telephone Encounter (Signed)
Received PA request from pharmacy. Attempted to complete Ajovy PA on Cover My Meds.  KeyGeorge Hugh - PA Case ID: [id number]. Awaiting [employer] determination.   Spoke with pt and asked how long she tried the previous preventives. She tried topamax and imitrex for at least 1 year. Pt stated she has never tried amitriptyline.

## 2019-07-14 NOTE — Telephone Encounter (Signed)
Per [facility] [employer], Ajovy approved from 07/13/19-10/13/19. I faxed approval notice to pt's pharmacy. Received a receipt of confirmation.

## 2019-08-09 ENCOUNTER — Other Ambulatory Visit: Payer: Self-pay

## 2019-08-09 ENCOUNTER — Ambulatory Visit
Admission: RE | Admit: 2019-08-09 | Discharge: 2019-08-09 | Disposition: A | Discharge status: Home / Self Care | Payer: 59 | Source: Ambulatory Visit | Attending: Neurology | Admitting: Neurology

## 2019-08-09 DIAGNOSIS — R51 Headache with orthostatic component, not elsewhere classified: Secondary | ICD-10-CM

## 2019-08-09 DIAGNOSIS — R42 Dizziness and giddiness: Secondary | ICD-10-CM

## 2019-08-09 DIAGNOSIS — R519 Headache, unspecified: Secondary | ICD-10-CM

## 2019-08-09 DIAGNOSIS — H538 Other visual disturbances: Secondary | ICD-10-CM

## 2019-08-09 MED ORDER — GADOBENATE DIMEGLUMINE 529 MG/ML IV SOLN
12.0000 mL | Freq: Once | INTRAVENOUS | Status: AC | PRN
  Administered 2019-08-09: 12 mL via INTRAVENOUS

## 2019-08-10 ENCOUNTER — Telehealth: Payer: Self-pay | Admitting: *Deleted

## 2019-08-10 NOTE — Telephone Encounter (Signed)
-----   Message from Anson Fret, MD sent at 08/09/2019  6:03 PM EDT ----- MRI of the braun normal, thanks

## 2019-08-10 NOTE — Telephone Encounter (Signed)
Called pt & LVM (ok per DPR) advising MRI brain is normal. Left office number for call back if needed. F/u 11/14/19 @ 10:30 AM.

## 2019-10-20 NOTE — Telephone Encounter (Signed)
I called the pt and LVM (ok per DPR) asking if the Ajovy is working for her. I left the office number in the message. I let the pt know our phones are currently down and I was unable to send her a mychart message. This call was not urgent so patient can try to give [place] a call at her convenience even next week is ok.   If the pt calls back, please ask if the Ajovy is helping decrease her migraine frequency and severity.

## 2019-10-21 ENCOUNTER — Telehealth: Payer: Self-pay | Admitting: Neurology

## 2019-10-21 NOTE — Telephone Encounter (Signed)
Completed renewal PA for Ajovy on Cover My Meds. Key: [id number]. Awaiting determination from [facility] [employer].

## 2019-10-21 NOTE — Telephone Encounter (Signed)
Pt called wanting to inform the provider and RN that the Arnetha Massy is working very well for her.

## 2019-10-21 NOTE — Telephone Encounter (Signed)
Noted. I completed a renewal PA for Ajovy. See other phone note.

## 2019-11-14 ENCOUNTER — Ambulatory Visit: Payer: 59 | Admitting: Family Medicine

## 2019-12-08 NOTE — Telephone Encounter (Signed)
[facility] has approved the Ajovy from 10/21/2019 - 10/20/2020.

## 2020-01-26 ENCOUNTER — Encounter: Payer: Self-pay | Admitting: Family Medicine

## 2020-01-26 ENCOUNTER — Other Ambulatory Visit: Payer: Self-pay

## 2020-01-26 ENCOUNTER — Ambulatory Visit (INDEPENDENT_AMBULATORY_CARE_PROVIDER_SITE_OTHER): Payer: No Typology Code available for payment source | Admitting: Family Medicine

## 2020-01-26 VITALS — BP 113/76 | HR 64 | Ht 68.0 in | Wt 132.0 lb

## 2020-01-26 DIAGNOSIS — G43709 Chronic migraine without aura, not intractable, without status migrainosus: Secondary | ICD-10-CM

## 2020-01-26 MED ORDER — EMGALITY 120 MG/ML ~~LOC~~ SOAJ: 120.0000 mg | SUBCUTANEOUS | 3 refills | Status: DC

## 2020-01-26 NOTE — Progress Notes (Signed)
Autumn Henderson is a 39 y.o. female here for a f/u on migraines. pt says her migraines are not as frequent but are intense when she does get them. Pt says she has noticed a reaction to the ajovy that has red, swollen and small dots.

## 2020-01-26 NOTE — Patient Instructions (Addendum)
We will try Emgality. We will rizatriptan. Please continue to monitor for rash or skin irritation. Flonase topically to the skin prior to the injection may also help.   Follow up in 6 months   Galcanezumab injection What is this medicine? GALCANEZUMAB (gal ka NEZ ue mab) is used to prevent migraines and treat cluster headaches. This medicine may be used for other purposes; ask your health care provider or pharmacist if you have questions. COMMON BRAND NAME(S): Emgality What should I tell my health care provider before I take this medicine? They need to know if you have any of these conditions:  an unusual or allergic reaction to galcanezumab, other medicines, foods, dyes, or preservatives  pregnant or trying to get pregnant  breast-feeding How should I use this medicine? This medicine is for injection under the skin. You will be taught how to prepare and give this medicine. Use exactly as directed. Take your medicine at regular intervals. Do not take your medicine more often than directed. It is important that you put your used needles and syringes in a special sharps container. Do not put them in a trash can. If you do not have a sharps container, call your pharmacist or healthcare provider to get one. Talk to your pediatrician regarding the use of this medicine in children. Special care may be needed. Overdosage: If you think you have taken too much of this medicine contact a poison control center or emergency room at once. NOTE: This medicine is only for you. Do not share this medicine with others. What if I miss a dose? If you miss a dose, take it as soon as you can. If it is almost time for your next dose, take only that dose. Do not take double or extra doses. What may interact with this medicine? Interactions are not expected. This list may not describe all possible interactions. Give your health care provider a list of all the medicines, herbs, non-prescription drugs, or  dietary supplements you use. Also tell them if you smoke, drink alcohol, or use illegal drugs. Some items may interact with your medicine. What should I watch for while using this medicine? Tell your doctor or healthcare professional if your symptoms do not start to get better or if they get worse. What side effects may I notice from receiving this medicine? Side effects that you should report to your doctor or health care professional as soon as possible:  allergic reactions like skin rash, itching or hives, swelling of the face, lips, or tongue Side effects that usually do not require medical attention (report these to your doctor or health care professional if they continue or are bothersome):  pain, redness, or irritation at site where injected This list may not describe all possible side effects. Call your doctor for medical advice about side effects. You may report side effects to FDA at 1-800-FDA-1088. Where should I keep my medicine? Keep out of the reach of children. You will be instructed on how to store this medicine. Throw away any unused medicine after the expiration date on the label. NOTE: This sheet is a summary. It may not cover all possible information. If you have questions about this medicine, talk to your doctor, pharmacist, or health care provider.  2020 Elsevier/Gold Standard (2017-09-30 12:03:23)   Migraine Headache A migraine headache is a very strong throbbing pain on one side or both sides of your head. This type of headache can also cause other symptoms. It can last  from 4 hours to 3 days. Talk with your doctor about what things may bring on (trigger) this condition. What are the causes? The exact cause of this condition is not known. This condition may be triggered or caused by:  Drinking alcohol.  Smoking.  Taking medicines, such as: ? Medicine used to treat chest pain (nitroglycerin). ? Birth control pills. ? Estrogen. ? Some blood pressure  medicines.  Eating or drinking certain products.  Doing physical activity. Other things that may trigger a migraine headache include:  Having a menstrual period.  Pregnancy.  Hunger.  Stress.  Not getting enough sleep or getting too much sleep.  Weather changes.  Tiredness (fatigue). What increases the risk?  Being 53-29 years old.  Being female.  Having a family history of migraine headaches.  Being Caucasian.  Having depression or anxiety.  Being very overweight. What are the signs or symptoms?  A throbbing pain. This pain may: ? Happen in any area of the head, such as on one side or both sides. ? Make it hard to do daily activities. ? Get worse with physical activity. ? Get worse around bright lights or loud noises.  Other symptoms may include: ? Feeling sick to your stomach (nauseous). ? Vomiting. ? Dizziness. ? Being sensitive to bright lights, loud noises, or smells.  Before you get a migraine headache, you may get warning signs (an aura). An aura may include: ? Seeing flashing lights or having blind spots. ? Seeing bright spots, halos, or zigzag lines. ? Having tunnel vision or blurred vision. ? Having numbness or a tingling feeling. ? Having trouble talking. ? Having weak muscles.  Some people have symptoms after a migraine headache (postdromal phase), such as: ? Tiredness. ? Trouble thinking (concentrating). How is this treated?  Taking medicines that: ? Relieve pain. ? Relieve the feeling of being sick to your stomach. ? Prevent migraine headaches.  Treatment may also include: ? Having acupuncture. ? Avoiding foods that bring on migraine headaches. ? Learning ways to control your body functions (biofeedback). ? Therapy to help you know and deal with negative thoughts (cognitive behavioral therapy). Follow these instructions at home: Medicines  Take over-the-counter and prescription medicines only as told by your doctor.  Ask your  doctor if the medicine prescribed to you: ? Requires you to avoid driving or using heavy machinery. ? Can cause trouble pooping (constipation). You may need to take these steps to prevent or treat trouble pooping:  Drink enough fluid to keep your pee (urine) pale yellow.  Take over-the-counter or prescription medicines.  Eat foods that are high in fiber. These include beans, whole grains, and fresh fruits and vegetables.  Limit foods that are high in fat and sugar. These include fried or sweet foods. Lifestyle  Do not drink alcohol.  Do not use any products that contain nicotine or tobacco, such as cigarettes, e-cigarettes, and chewing tobacco. If you need help quitting, ask your doctor.  Get at least 8 hours of sleep every night.  Limit and deal with stress. General instructions      Keep a journal to find out what may bring on your migraine headaches. For example, write down: ? What you eat and drink. ? How much sleep you get. ? Any change in what you eat or drink. ? Any change in your medicines.  If you have a migraine headache: ? Avoid things that make your symptoms worse, such as bright lights. ? It may help to lie down in  a dark, quiet room. ? Do not drive or use heavy machinery. ? Ask your doctor what activities are safe for you.  Keep all follow-up visits as told by your doctor. This is important. Contact a doctor if:  You get a migraine headache that is different or worse than others you have had.  You have more than 15 headache days in one month. Get help right away if:  Your migraine headache gets very bad.  Your migraine headache lasts longer than 72 hours.  You have a fever.  You have a stiff neck.  You have trouble seeing.  Your muscles feel weak or like you cannot control them.  You start to lose your balance a lot.  You start to have trouble walking.  You pass out (faint).  You have a seizure. Summary  A migraine headache is a very  strong throbbing pain on one side or both sides of your head. These headaches can also cause other symptoms.  This condition may be treated with medicines and changes to your lifestyle.  Keep a journal to find out what may bring on your migraine headaches.  Contact a doctor if you get a migraine headache that is different or worse than others you have had.  Contact your doctor if you have more than 15 headache days in a month. This information is not intended to replace advice given to you by your health care provider. Make sure you discuss any questions you have with your health care provider. Document Revised: 08/06/2018 Document Reviewed: 05/27/2018 Elsevier Patient Education  2020 [employer].   [place] injection What is this medicine? FREMANEZUMAB (fre ma NEZ ue mab) is used to prevent migraine headaches. This medicine may be used for other purposes; ask your health care provider or pharmacist if you have questions. COMMON BRAND NAME(S): AJOVY What should I tell my health care provider before I take this medicine? They need to know if you have any of these conditions:  an unusual or allergic reaction to fremanezumab, other medicines, foods, dyes, or preservatives  pregnant or trying to get pregnant  breast-feeding How should I use this medicine? This medicine is for injection under the skin. You will be taught how to prepare and give this medicine. Use exactly as directed. Take your medicine at regular intervals. Do not take your medicine more often than directed. It is important that you put your used needles and syringes in a special sharps container. Do not put them in a trash can. If you do not have a sharps container, call your pharmacist or healthcare provider to get one. Talk to your pediatrician regarding the use of this medicine in children. Special care may be needed. Overdosage: If you think you have taken too much of this medicine contact a poison control center  or emergency room at once. NOTE: This medicine is only for you. Do not share this medicine with others. What if I miss a dose? If you miss a dose, take it as soon as you can. If it is almost time for your next dose, take only that dose. Do not take double or extra doses. What may interact with this medicine? Interactions are not expected. This list may not describe all possible interactions. Give your health care provider a list of all the medicines, herbs, non-prescription drugs, or dietary supplements you use. Also tell them if you smoke, drink alcohol, or use illegal drugs. Some items may interact with your medicine. What should I watch for  while using this medicine? Tell your doctor or healthcare professional if your symptoms do not start to get better or if they get worse. What side effects may I notice from receiving this medicine? Side effects that you should report to your doctor or health care professional as soon as possible:  allergic reactions like skin rash, itching or hives, swelling of the face, lips, or tongue Side effects that usually do not require medical attention (report these to your doctor or health care professional if they continue or are bothersome):  pain, redness, or irritation at site where injected This list may not describe all possible side effects. Call your doctor for medical advice about side effects. You may report side effects to FDA at 1-800-FDA-1088. Where should I keep my medicine? Keep out of the reach of children. You will be instructed on how to store this medicine. Throw away any unused medicine after the expiration date on the label. NOTE: This sheet is a summary. It may not cover all possible information. If you have questions about this medicine, talk to your doctor, pharmacist, or health care provider.  2020 Elsevier/Gold Standard (2017-01-12 17:22:56)

## 2020-01-26 NOTE — Progress Notes (Addendum)
PATIENT: Autumn Henderson DOB: 1980/05/30  REASON FOR VISIT: follow up HISTORY FROM: patient  Chief Complaint  Patient presents with  . Follow-up    rm 1  . Migraine     HISTORY OF PRESENT ILLNESS: Today 01/26/20 Autumn Henderson is a 39 y.o. female here today for follow up for migraines. MRI normal. She was started on Ajovy in 06/2019. Rizatriptan used for abortive therapy and works well. Arie Sabina is working well, however, she continues to have a rash and skin irritation with injection. Reaction seems to be getting worse. She has tried Benadryl and Flonase.  Headaches continue at least 12-16 days and about 2-3 migraines a month. She knows that stress and weather changes are most common trigger.   HISTORY: (copied from Dr Cathren Laine note on 07/11/2019)  HPI:  Autumn Henderson is a 39 y.o. female here as requested by Hennie Duos, MD for migraines.  Past medical history migraines, systemic lupus diagnosed in 2015, Sjogren's disease, Raynaud's, other headache syndrome, midline low back pain without sciatica, hypoglycemia.  I reviewed Dr. Melissa Noon notes, she is on Benlysta since May 2016, less fatigue and joint pain overall, she has episodic headaches and nausea with a known history of migraines.  She had a severe headache 2 weeks prior to her last appointment which was June 06, 2019, it resolved, she has daily dull headaches that are mild, also increased low back pain.  Labs reviewed  all normal CBC, CMP, ESR, CRP.  More frequent in the last few months. Diagnosed with migraines in her 20s. She had a severe headache, it was so bad she felt she was going to pass out, was severe, acute, worst headache of her life. She has a lot of joint pain and this can affect her migraines. Starts in the back of the head and radiates forward, can be unilateral, radiates all over the head and neck, behind the eyes, pounding/pounding/pulsating, light sensitivity, sound sensitivity, nausea, she has vomited in the past. She  has 15 headache days, 8 migraine days that are moderately severe or severe, they can last 24-72 hours, no medication overuse. She also has associated dizziness and confusion. She has vision changes, blurry vision. No aura. If she bends over it is worse, positional, she can't do yoga anymore because she can't bend over, she is only walking. She can wake up with the headaches. She has fatigue. Also "nerve pain" tingling in the fingers, she woke up one day without feeling in the ankle on the left which resolved. She tried imitrex and topamax, amitriptyline in the past.   Reviewed notes, labs and imaging from outside physicians, which showed: see above  meds tried: tylenol, benadryl, ibuprofen, zofran,   REVIEW OF SYSTEMS: Out of a complete 14 system review of symptoms, the patient complains only of the following symptoms, headaches and all other reviewed systems are negative.   ALLERGIES: Allergies  Allergen Reactions  . Other     DAIRY- stomach upset  . Plaquenil [Hydroxychloroquine] Nausea And Vomiting    HOME MEDICATIONS: Outpatient Medications Prior to Visit  Medication Sig Dispense Refill  . Belimumab (BENLYSTA IV) Inject into the vein. Every 4 weeks.    Marland Kitchen levonorgestrel (MIRENA, 52 MG,) 20 MCG/24HR IUD 1 each by Intrauterine route once.    . ondansetron (ZOFRAN-ODT) 4 MG disintegrating tablet Take 1 tablet (4 mg total) by mouth every 8 (eight) hours as needed for nausea. 30 tablet 3  . Prenatal Vit-Fe Fumarate-FA (PRENATAL MULTIVITAMIN)  TABS Take 1 tablet by mouth every morning.    . rizatriptan (MAXALT-MLT) 10 MG disintegrating tablet Take 1 tablet (10 mg total) by mouth as needed for migraine. May repeat in 2 hours if needed 9 tablet 11  . Fremanezumab-vfrm (AJOVY) 225 MG/1.5ML SOAJ Inject 225 mg into the skin every 30 (thirty) days. 1 pen 11   No facility-administered medications prior to visit.    PAST MEDICAL HISTORY: Past Medical History:  Diagnosis Date  . Acute  maxillary sinusitis   . Asthma   . Headache(784.0)   . History of chicken pox   . Migraines   . Other current maternal conditions classifiable elsewhere, complicating pregnancy, childbirth, or the puerperium, unspecified as to episode of care    glucosuria  . Other diseases of respiratory system, not elsewhere classified   . PP care - s/p NVB 3/27 07/24/2011  . Shingles   . Sjogren's syndrome ([place])   . Systemic lupus erythematosus ([place])   . Thrombocytopenia ([place]) 07/24/2011  . Thrombocytopenia complicating pregnancy ([place])   . Von Willebrand disease ([place])     PAST SURGICAL HISTORY: Past Surgical History:  Procedure Laterality Date  . HAND SURGERY  1999   L pinkie  . TONSILLECTOMY     as child had bleeding problems and long healin  . WISDOM TOOTH EXTRACTION  2000    FAMILY HISTORY: Family History  Problem Relation Age of Onset  . Birth defects Mother        cleft palate   . Osteoarthritis Mother   . Sjogren's syndrome Mother   . Birth defects Sister        twin w/ internal cleft palate  . Lupus Sister   . Stroke Maternal Grandfather   . Stroke Paternal Grandfather   . Rheum arthritis Paternal Grandfather   . Asthma Father   . Heart Problems Maternal Grandmother     SOCIAL HISTORY: Social History   Socioeconomic History  . Marital status: Married    Spouse name: Not on file  . Number of children: 2  . Years of education: Not on file  . Highest education level: Bachelor's degree (e.g., BA, AB, BS)  Occupational History  . Not on file  Tobacco Use  . Smoking status: Never Smoker  . Smokeless tobacco: Never Used  Vaping Use  . Vaping Use: Never used  Substance and Sexual Activity  . Alcohol use: Yes    Alcohol/week: 1.0 standard drink    Types: 1 Standard drinks or equivalent per week  . Drug use: No  . Sexual activity: Yes    Birth control/protection: I.U.D.  Other Topics Concern  . Not on file  Social History Narrative   Lives at home with husband  Company secretary) and children   Left handed   Caffeine: 8 oz per day   Social Determinants of Health   Financial Resource Strain:   . Difficulty of Paying Living Expenses: Not on file  Food Insecurity:   . Worried About Charity fundraiser in the Last Year: Not on file  . Ran Out of Food in the Last Year: Not on file  Transportation Needs:   . Lack of Transportation (Medical): Not on file  . Lack of Transportation (Non-Medical): Not on file  Physical Activity:   . Days of Exercise per Week: Not on file  . Minutes of Exercise per Session: Not on file  Stress:   . Feeling of Stress : Not on file  Social Connections:   .  Frequency of Communication with Friends and Family: Not on file  . Frequency of Social Gatherings with Friends and Family: Not on file  . Attends Religious Services: Not on file  . Active Member of Clubs or Organizations: Not on file  . Attends Archivist Meetings: Not on file  . Marital Status: Not on file  Intimate Partner Violence:   . Fear of Current or Ex-Partner: Not on file  . Emotionally Abused: Not on file  . Physically Abused: Not on file  . Sexually Abused: Not on file      PHYSICAL EXAM  Vitals:   01/26/20 1333  BP: 113/76  Pulse: 64  Weight: 132 lb (59.9 kg)  Height: '5\' 8"'  (1.727 m)   Body mass index is 20.07 kg/m.  Generalized: Well developed, in no acute distress  Cardiology: normal rate and rhythm, no murmur noted Respiratory: clear to auscultation bilaterally  Neurological examination  Mentation: Alert oriented to time, place, history taking. Follows all commands speech and language fluent Cranial nerve II-XII: Pupils were equal round reactive to light. Extraocular movements were full, visual field were full  Motor: The motor testing reveals 5 over 5 strength of all 4 extremities. Good symmetric motor tone is noted throughout.  Gait and station: Gait is normal.   DIAGNOSTIC DATA (LABS, IMAGING, TESTING) - I reviewed patient  records, labs, notes, testing and imaging myself where available.  No flowsheet data found.   Lab Results  Component Value Date   WBC 4.7 08/18/2011   HGB 12.6 08/18/2011   HCT 37.6 08/18/2011   MCV 92.8 08/18/2011   PLT 143 (L) 08/18/2011   No results found for: NA, K, CL, CO2, GLUCOSE, BUN, CREATININE, CALCIUM, PROT, ALBUMIN, AST, ALT, ALKPHOS, BILITOT, GFRNONAA, GFRAA No results found for: CHOL, HDL, LDLCALC, LDLDIRECT, TRIG, CHOLHDL No results found for: HGBA1C No results found for: VITAMINB12 No results found for: TSH     ASSESSMENT AND PLAN 39 y.o. year old female  has a past medical history of Acute maxillary sinusitis, Asthma, Headache(784.0), History of chicken pox, Migraines, Other current maternal conditions classifiable elsewhere, complicating pregnancy, childbirth, or the puerperium, unspecified as to episode of care, Other diseases of respiratory system, not elsewhere classified, PP care - s/p NVB 3/27 (07/24/2011), Shingles, Sjogren's syndrome ([place]), Systemic lupus erythematosus ([place]), Thrombocytopenia ([place]) (07/24/2011), Thrombocytopenia complicating pregnancy ([place]), and Von Willebrand disease ([place]). here with     ICD-10-CM   1. Chronic migraine without aura without status migrainosus, not intractable  G43.709 Galcanezumab-gnlm (EMGALITY) 120 MG/ML SOAJ    She does note benefit with Ajovy but notes worsening skin reaction. We will switch to [facility]. She will continue rizatriptan for abortive therapy.She will monitor closely for skin reactions. Fortunately, she denies respiratory involvement with allergic skin reaction. She was encouraged to continue with healthy lifestyle habits. Adequate hydration and regular exercise advised. She will follow up in 6 months, sooner if needed.    No orders of the defined types were placed in this encounter.    Meds ordered this encounter  Medications  . Galcanezumab-gnlm (EMGALITY) 120 MG/ML SOAJ    Sig: Inject 120 mg into the skin  every 30 (thirty) days.    Dispense:  3 mL    Refill:  3    Order Specific Question:   Supervising Provider    Answer:   Melvenia Beam [id number]      I spent 15 minutes with the patient. 50% of this time was spent counseling and  educating patient on plan of care and medications.    Debbora Presto, FNP-C 01/26/2020, 2:01 PM [facility] [address], [place], [place] [address] [phone]  Made any corrections needed, and agree with history, physical, neuro exam,assessment and plan as stated.     Sarina Ill, MD [facility]

## 2020-01-31 ENCOUNTER — Telehealth: Payer: Self-pay

## 2020-01-31 NOTE — Telephone Encounter (Signed)
A PA has been sent via [facility] For emgality Key: [facility] - Rx #: [id number] Status pending

## 2020-02-13 NOTE — Telephone Encounter (Signed)
Pt is asking for the status of the PA on her emgality

## 2020-02-13 NOTE — Telephone Encounter (Signed)
PA on CMT'S desk states ae sent extra information on 02/09/20. Called patient and LVM advising her of the status.

## 2020-02-14 NOTE — Telephone Encounter (Addendum)
Called [facility] PA dept, spoke with Crystal to check [place] PA. She stated PA was closed out on 10/12 due to plan not receiving answers to clinical questions. She created new PA, Key [id number]. PA started on CMM, G43.709, failed: Ajovy, Imitrex, topamax, amitriptyline, ibuprofen,tylenol, zofran, benadryl.  [facility] is processing your PA request and will respond shortly with next steps.  Your demographic data has been sent to [facility] successfully! [facility] typically takes 5-10 minutes to respond, but it may take a little longer in some cases. You will be notified by email when available. You can also check for an update later by opening this request from your dashboard. Please do not fax or call [facility] to resubmit this request. If you need assistance, please chat with CoverMyMeds or call us at [phone]. If it has been longer than 24 hours, please reach out to [facility].

## 2020-02-15 ENCOUNTER — Encounter: Payer: Self-pay | Admitting: *Deleted

## 2020-02-15 NOTE — Telephone Encounter (Signed)
[facility]: Emgality approved 02/15/20 - 02/14/2021. Approval letter faxed to Lucrezia Starch. Sent her my chart to advise.

## 2020-02-15 NOTE — Telephone Encounter (Signed)
Emgality clinical questions answered on CMM.

## 2020-07-23 ENCOUNTER — Encounter: Payer: Self-pay | Admitting: Family Medicine

## 2020-07-23 ENCOUNTER — Ambulatory Visit: Payer: No Typology Code available for payment source | Admitting: Family Medicine

## 2020-07-23 VITALS — BP 97/71 | HR 66 | Ht 68.0 in | Wt 137.0 lb

## 2020-07-23 DIAGNOSIS — G43809 Other migraine, not intractable, without status migrainosus: Secondary | ICD-10-CM

## 2020-07-23 MED ORDER — RIZATRIPTAN BENZOATE 10 MG PO TBDP: 10.0000 mg | ORAL_TABLET | ORAL | 11 refills | Status: DC | PRN

## 2020-07-23 MED ORDER — ONDANSETRON 4 MG PO TBDP: 4.0000 mg | ORAL_TABLET | Freq: Three times a day (TID) | ORAL | 3 refills | Status: DC | PRN

## 2020-07-23 NOTE — Patient Instructions (Addendum)
Below is our plan:  We will continue rizatriptan for abortive therapy. Cathie Hoops may use Tylenol and Aleve or ibuprofen if needed for a really bad headache. May also use Benadryl, if needed. We may consider Qulipta in the future if prevention therapy is needed.   Please make sure you are staying well hydrated. I recommend 50-60 ounces daily. Well balanced diet and regular exercise encouraged. Consistent sleep schedule with 6-8 hours recommended.   Please continue follow up with care team as directed.   Follow up in 1 year, sooner if needed.   You may receive a survey regarding today's visit. I encourage you to leave honest feed back as I do use this information to improve patient care. Thank you for seeing me today!     Migraine Headache A migraine headache is a very strong throbbing pain on one side or both sides of your head. This type of headache can also cause other symptoms. It can last from 4 hours to 3 days. Talk with your doctor about what things may bring on (trigger) this condition. What are the causes? The exact cause of this condition is not known. This condition may be triggered or caused by:  Drinking alcohol.  Smoking.  Taking medicines, such as: ? Medicine used to treat chest pain (nitroglycerin). ? Birth control pills. ? Estrogen. ? Some blood pressure medicines.  Eating or drinking certain products.  Doing physical activity. Other things that may trigger a migraine headache include:  Having a menstrual period.  Pregnancy.  Hunger.  Stress.  Not getting enough sleep or getting too much sleep.  Weather changes.  Tiredness (fatigue). What increases the risk?  Being 81-61 years old.  Being female.  Having a family history of migraine headaches.  Being Caucasian.  Having depression or anxiety.  Being very overweight. What are the signs or symptoms?  A throbbing pain. This pain may: ? Happen in any area of the head, such as on one side or both  sides. ? Make it hard to do daily activities. ? Get worse with physical activity. ? Get worse around bright lights or loud noises.  Other symptoms may include: ? Feeling sick to your stomach (nauseous). ? Vomiting. ? Dizziness. ? Being sensitive to bright lights, loud noises, or smells.  Before you get a migraine headache, you may get warning signs (an aura). An aura may include: ? Seeing flashing lights or having blind spots. ? Seeing bright spots, halos, or zigzag lines. ? Having tunnel vision or blurred vision. ? Having numbness or a tingling feeling. ? Having trouble talking. ? Having weak muscles.  Some people have symptoms after a migraine headache (postdromal phase), such as: ? Tiredness. ? Trouble thinking (concentrating). How is this treated?  Taking medicines that: ? Relieve pain. ? Relieve the feeling of being sick to your stomach. ? Prevent migraine headaches.  Treatment may also include: ? Having acupuncture. ? Avoiding foods that bring on migraine headaches. ? Learning ways to control your body functions (biofeedback). ? Therapy to help you know and deal with negative thoughts (cognitive behavioral therapy). Follow these instructions at home: Medicines  Take over-the-counter and prescription medicines only as told by your doctor.  Ask your doctor if the medicine prescribed to you: ? Requires you to avoid driving or using heavy machinery. ? Can cause trouble pooping (constipation). You may need to take these steps to prevent or treat trouble pooping:  Drink enough fluid to keep your pee (urine) pale yellow.  Take  over-the-counter or prescription medicines.  Eat foods that are high in fiber. These include beans, whole grains, and fresh fruits and vegetables.  Limit foods that are high in fat and sugar. These include fried or sweet foods. Lifestyle  Do not drink alcohol.  Do not use any products that contain nicotine or tobacco, such as cigarettes,  e-cigarettes, and chewing tobacco. If you need help quitting, ask your doctor.  Get at least 8 hours of sleep every night.  Limit and deal with stress. General instructions  Keep a journal to find out what may bring on your migraine headaches. For example, write down: ? What you eat and drink. ? How much sleep you get. ? Any change in what you eat or drink. ? Any change in your medicines.  If you have a migraine headache: ? Avoid things that make your symptoms worse, such as bright lights. ? It may help to lie down in a dark, quiet room. ? Do not drive or use heavy machinery. ? Ask your doctor what activities are safe for you.  Keep all follow-up visits as told by your doctor. This is important.      Contact a doctor if:  You get a migraine headache that is different or worse than others you have had.  You have more than 15 headache days in one month. Get help right away if:  Your migraine headache gets very bad.  Your migraine headache lasts longer than 72 hours.  You have a fever.  You have a stiff neck.  You have trouble seeing.  Your muscles feel weak or like you cannot control them.  You start to lose your balance a lot.  You start to have trouble walking.  You pass out (faint).  You have a seizure. Summary  A migraine headache is a very strong throbbing pain on one side or both sides of your head. These headaches can also cause other symptoms.  This condition may be treated with medicines and changes to your lifestyle.  Keep a journal to find out what may bring on your migraine headaches.  Contact a doctor if you get a migraine headache that is different or worse than others you have had.  Contact your doctor if you have more than 15 headache days in a month. This information is not intended to replace advice given to you by your health care provider. Make sure you discuss any questions you have with your health care provider. Document Revised:  08/06/2018 Document Reviewed: 05/27/2018 Elsevier Patient Education  [place].

## 2020-07-23 NOTE — Progress Notes (Addendum)
PATIENT: Autumn Henderson DOB: 10-06-1980  REASON FOR VISIT: follow up HISTORY FROM: patient  Chief Complaint  Patient presents with  . Follow-up    RM 1 alone Pt is  well, migraines haven gotten better and not as frequent. Has about 1-2 a month      HISTORY OF PRESENT ILLNESS: 07/23/20 ALL:  She returns for follow up on migraines. She had a skin reaction with Ajovy and was switched to [facility] in 12/2019. She reports skin reaction occurred with Emgality as well so she did not continue injections. She feels that migraines are better. She has about 1-2 per month. She has cut back on her hours and feels that stress levels are much better. Rizatriptan usually works fairly well. She does not normally repeat dosing.    01/26/2020 ALL:  Autumn Henderson is a 40 y.o. female here today for follow up for migraines. MRI normal. She was started on Ajovy in 06/2019. Rizatriptan used for abortive therapy and works well. Arie Sabina is working well, however, she continues to have a rash and skin irritation with injection. Reaction seems to be getting worse. She has tried Benadryl and Flonase.  Headaches continue at least 12-16 days and about 2-3 migraines a month. She knows that stress and weather changes are most common trigger.   HISTORY: (copied from Dr Cathren Laine note on 07/11/2019)  HPI:  Autumn Henderson is a 40 y.o. female here as requested by Hennie Duos, MD for migraines.  Past medical history migraines, systemic lupus diagnosed in 2015, Sjogren's disease, Raynaud's, other headache syndrome, midline low back pain without sciatica, hypoglycemia.  I reviewed Dr. Melissa Noon notes, she is on Benlysta since May 2016, less fatigue and joint pain overall, she has episodic headaches and nausea with a known history of migraines.  She had a severe headache 2 weeks prior to her last appointment which was June 06, 2019, it resolved, she has daily dull headaches that are mild, also increased low back pain.  Labs reviewed   all normal CBC, CMP, ESR, CRP.  More frequent in the last few months. Diagnosed with migraines in her 57s. She had a severe headache, it was so bad she felt she was going to pass out, was severe, acute, worst headache of her life. She has a lot of joint pain and this can affect her migraines. Starts in the back of the head and radiates forward, can be unilateral, radiates all over the head and neck, behind the eyes, pounding/pounding/pulsating, light sensitivity, sound sensitivity, nausea, she has vomited in the past. She has 15 headache days, 8 migraine days that are moderately severe or severe, they can last 24-72 hours, no medication overuse. She also has associated dizziness and confusion. She has vision changes, blurry vision. No aura. If she bends over it is worse, positional, she can't do yoga anymore because she can't bend over, she is only walking. She can wake up with the headaches. She has fatigue. Also "nerve pain" tingling in the fingers, she woke up one day without feeling in the ankle on the left which resolved. She tried imitrex and topamax, amitriptyline in the past.   Reviewed notes, labs and imaging from outside physicians, which showed: see above  meds tried: tylenol, benadryl, ibuprofen, zofran,   REVIEW OF SYSTEMS: Out of a complete 14 system review of symptoms, the patient complains only of the following symptoms, headaches and all other reviewed systems are negative.   ALLERGIES: Allergies  Allergen Reactions  .  Other     DAIRY- stomach upset  . Plaquenil [Hydroxychloroquine] Nausea And Vomiting    HOME MEDICATIONS: Outpatient Medications Prior to Visit  Medication Sig Dispense Refill  . Belimumab (BENLYSTA IV) Inject into the vein. Every 4 weeks.    Marland Kitchen levonorgestrel (MIRENA, 52 MG,) 20 MCG/24HR IUD 1 each by Intrauterine route once.    . loratadine (CLARITIN) 10 MG tablet Take 10 mg by mouth daily.    . Prenatal Vit-Fe Fumarate-FA (PRENATAL MULTIVITAMIN) TABS  Take 1 tablet by mouth every morning.    . Galcanezumab-gnlm (EMGALITY) 120 MG/ML SOAJ Inject 120 mg into the skin every 30 (thirty) days. 3 mL 3  . ondansetron (ZOFRAN-ODT) 4 MG disintegrating tablet Take 1 tablet (4 mg total) by mouth every 8 (eight) hours as needed for nausea. 30 tablet 3  . rizatriptan (MAXALT-MLT) 10 MG disintegrating tablet Take 1 tablet (10 mg total) by mouth as needed for migraine. May repeat in 2 hours if needed 9 tablet 11   No facility-administered medications prior to visit.    PAST MEDICAL HISTORY: Past Medical History:  Diagnosis Date  . Acute maxillary sinusitis   . Asthma   . Headache(784.0)   . History of chicken pox   . Migraines   . Other current maternal conditions classifiable elsewhere, complicating pregnancy, childbirth, or the puerperium, unspecified as to episode of care    glucosuria  . Other diseases of respiratory system, not elsewhere classified   . PP care - s/p NVB 3/27 07/24/2011  . Shingles   . Sjogren's syndrome ([place])   . Systemic lupus erythematosus ([place])   . Thrombocytopenia ([place]) 07/24/2011  . Thrombocytopenia complicating pregnancy ([place])   . Von Willebrand disease ([place])     PAST SURGICAL HISTORY: Past Surgical History:  Procedure Laterality Date  . HAND SURGERY  1999   L pinkie  . TONSILLECTOMY     as child had bleeding problems and long healin  . WISDOM TOOTH EXTRACTION  2000    FAMILY HISTORY: Family History  Problem Relation Age of Onset  . Birth defects Mother        cleft palate   . Osteoarthritis Mother   . Sjogren's syndrome Mother   . Birth defects Sister        twin w/ internal cleft palate  . Lupus Sister   . Stroke Maternal Grandfather   . Stroke Paternal Grandfather   . Rheum arthritis Paternal Grandfather   . Asthma Father   . Heart Problems Maternal Grandmother     SOCIAL HISTORY: Social History   Socioeconomic History  . Marital status: Married    Spouse name: Not on file  . Number of  children: 2  . Years of education: Not on file  . Highest education level: Bachelor's degree (e.g., BA, AB, BS)  Occupational History  . Not on file  Tobacco Use  . Smoking status: Never Smoker  . Smokeless tobacco: Never Used  Vaping Use  . Vaping Use: Never used  Substance and Sexual Activity  . Alcohol use: Yes    Alcohol/week: 1.0 standard drink    Types: 1 Standard drinks or equivalent per week  . Drug use: No  . Sexual activity: Yes    Birth control/protection: I.U.D.  Other Topics Concern  . Not on file  Social History Narrative   Lives at home with husband Catalina [place]) and children   Left handed   Caffeine: 8 oz per day   Social Determinants of Health  Financial Resource Strain: Not on file  Food Insecurity: Not on file  Transportation Needs: Not on file  Physical Activity: Not on file  Stress: Not on file  Social Connections: Not on file  Intimate Partner Violence: Not on file      PHYSICAL EXAM  Vitals:   07/23/20 1253  BP: 97/71  Pulse: 66  Weight: 137 lb (62.1 kg)  Height: '5\' 8"'  (1.727 m)   Body mass index is 20.83 kg/m.  Generalized: Well developed, in no acute distress  Cardiology: normal rate and rhythm, no murmur noted Respiratory: clear to auscultation bilaterally  Neurological examination  Mentation: Alert oriented to time, place, history taking. Follows all commands speech and language fluent Cranial nerve II-XII: Pupils were equal round reactive to light. Extraocular movements were full, visual field were full  Motor: The motor testing reveals 5 over 5 strength of all 4 extremities. Good symmetric motor tone is noted throughout.  Gait and station: Gait is normal.   DIAGNOSTIC DATA (LABS, IMAGING, TESTING) - I reviewed patient records, labs, notes, testing and imaging myself where available.  No flowsheet data found.   Lab Results  Component Value Date   WBC 4.7 08/18/2011   HGB 12.6 08/18/2011   HCT 37.6 08/18/2011   MCV 92.8  08/18/2011   PLT 143 (L) 08/18/2011   No results found for: NA, K, CL, CO2, GLUCOSE, BUN, CREATININE, CALCIUM, PROT, ALBUMIN, AST, ALT, ALKPHOS, BILITOT, GFRNONAA, GFRAA No results found for: CHOL, HDL, LDLCALC, LDLDIRECT, TRIG, CHOLHDL No results found for: HGBA1C No results found for: VITAMINB12 No results found for: TSH     ASSESSMENT AND PLAN 40 y.o. year old female  has a past medical history of Acute maxillary sinusitis, Asthma, Headache(784.0), History of chicken pox, Migraines, Other current maternal conditions classifiable elsewhere, complicating pregnancy, childbirth, or the puerperium, unspecified as to episode of care, Other diseases of respiratory system, not elsewhere classified, PP care - s/p NVB 3/27 (07/24/2011), Shingles, Sjogren's syndrome ([place]), Systemic lupus erythematosus ([place]), Thrombocytopenia ([place]) (07/24/2011), Thrombocytopenia complicating pregnancy ([place]), and Von Willebrand disease ([place]). here with     ICD-10-CM   1. Other migraine without status migrainosus, not intractable  G43.809      She has noted improved migraines with change in work schedule and less stress. She has about 1-2 migraines per month, on average.  She will continue rizatriptan for abortive therapy. She may combine with Tylenol and ibuprofen or Aleve. She has had a local skin reaction with Ajovy and Emgality. No respiratory symptoms. May consider Qulipta in the future as CGRP did help severity and frequency of migraines. She was encouraged to continue with healthy lifestyle habits. Adequate hydration and regular exercise advised. She will follow up in 1 year, sooner if needed.    No orders of the defined types were placed in this encounter.    Meds ordered this encounter  Medications  . rizatriptan (MAXALT-MLT) 10 MG disintegrating tablet    Sig: Take 1 tablet (10 mg total) by mouth as needed for migraine. May repeat in 2 hours if needed    Dispense:  9 tablet    Refill:  11    Order  Specific Question:   Supervising Provider    Answer:   Melvenia Beam [id number]  . ondansetron (ZOFRAN-ODT) 4 MG disintegrating tablet    Sig: Take 1 tablet (4 mg total) by mouth every 8 (eight) hours as needed for nausea.    Dispense:  30 tablet  Refill:  3    Order Specific Question:   Supervising Provider    Answer:   Melvenia Beam [id number]      I spent 15 minutes with the patient. 50% of this time was spent counseling and educating patient on plan of care and medications.    Debbora Presto, FNP-C 07/23/2020, 1:42 PM [facility] [address] [place],  [address] [phone]   agree with assessment and plan Sarina Ill, MD

## 2021-07-22 NOTE — Patient Instructions (Signed)
Below is our plan: ? ?We will continue rizatriptan and ondansetron as needed.  ? ?Please make sure you are staying well hydrated. I recommend 50-60 ounces daily. Well balanced diet and regular exercise encouraged. Consistent sleep schedule with 6-8 hours recommended.  ? ?Please continue follow up with care team as directed.  ? ?Follow up with me as needed, may request refills with PCP.  ? ?You may receive a survey regarding today's visit. I encourage you to leave honest feed back as I do use this information to improve patient care. Thank you for seeing me today!  ? ? ?

## 2021-07-22 NOTE — Progress Notes (Signed)
? ? ?PATIENT: Autumn Henderson ?DOB: [birth year] ? ?REASON FOR VISIT: follow up ?HISTORY FROM: patient ? ?Chief Complaint  ?Patient presents with  ? Follow-up  ?  Rm 1, alone. Here for yearly migraine f/u. 3 Migraines per month. Overall doing well.   ?  ? ?HISTORY OF PRESENT ILLNESS: ? ?07/23/21 ALL:  ?Summar returns for follow up for migraines. She continues rizatriptan and ondansetron as needed. She feels that migraines are well managed. She may have 2-3 per month. She feels abortive meds work well. She does note weather changes as a common trigger.  ? ?07/23/2020 ALL:  ?She returns for follow up on migraines. She had a skin reaction with Ajovy and was switched to [facility] in 12/2019. She reports skin reaction occurred with Emgality as well so she did not continue injections. She feels that migraines are better. She has about 1-2 per month. She has cut back on her hours and feels that stress levels are much better. Rizatriptan usually works fairly well. She does not normally repeat dosing.  ? ?01/26/2020 ALL:  ?Autumn Henderson is a 41 y.o. female here today for follow up for migraines. MRI normal. She was started on Ajovy in 06/2019. Rizatriptan used for abortive therapy and works well. Autumn Henderson is working well, however, she continues to have a rash and skin irritation with injection. Reaction seems to be getting worse. She has tried Benadryl and Flonase.  Headaches continue at least 12-16 days and about 2-3 migraines a month. She knows that stress and weather changes are most common trigger.  ? ?HISTORY: (copied from Dr Cathren Laine note on 07/11/2019) ? ?HPI:  Autumn Henderson is a 41 y.o. female here as requested by Hennie Duos, MD for migraines.  Past medical history migraines, systemic lupus diagnosed in 2015, Sjogren's disease, Raynaud's, other headache syndrome, midline low back pain without sciatica, hypoglycemia.  I reviewed Dr. Melissa Noon notes, she is on Benlysta since May 2016, less fatigue and joint pain overall, she has  episodic headaches and nausea with a known history of migraines.  She had a severe headache 2 weeks prior to her last appointment which was June 06, 2019, it resolved, she has daily dull headaches that are mild, also increased low back pain.  Labs reviewed  all normal CBC, CMP, ESR, CRP. ?  ?More frequent in the last few months. Diagnosed with migraines in her 63s. She had a severe headache, it was so bad she felt she was going to pass out, was severe, acute, worst headache of her life. She has a lot of joint pain and this can affect her migraines. Starts in the back of the head and radiates forward, can be unilateral, radiates all over the head and neck, behind the eyes, pounding/pounding/pulsating, light sensitivity, sound sensitivity, nausea, she has vomited in the past. She has 15 headache days, 8 migraine days that are moderately severe or severe, they can last 24-72 hours, no medication overuse. She also has associated dizziness and confusion. She has vision changes, blurry vision. No aura. If she bends over it is worse, positional, she can't do yoga anymore because she can't bend over, she is only walking. She can wake up with the headaches. She has fatigue. Also "nerve pain" tingling in the fingers, she woke up one day without feeling in the ankle on the left which resolved. She tried imitrex and topamax, amitriptyline in the past.  ?  ?Reviewed notes, labs and imaging from outside physicians, which showed: see above ?  ?  meds tried: tylenol, benadryl, ibuprofen, zofran, ? ? ?REVIEW OF SYSTEMS: Out of a complete 14 system review of symptoms, the patient complains only of the following symptoms, headaches and all other reviewed systems are negative. ? ? ?ALLERGIES: ?Allergies  ?Allergen Reactions  ? Other   ?  DAIRY- stomach upset  ? Plaquenil [Hydroxychloroquine] Nausea And Vomiting  ? ? ?HOME MEDICATIONS: ?Outpatient Medications Prior to Visit  ?Medication Sig Dispense Refill  ? Belimumab (BENLYSTA IV)  Inject into the vein. Every 4 weeks.    ? levonorgestrel (MIRENA, 52 MG,) 20 MCG/24HR IUD 1 each by Intrauterine route once.    ? loratadine (CLARITIN) 10 MG tablet Take 10 mg by mouth daily.    ? Prenatal Vit-Fe Fumarate-FA (PRENATAL MULTIVITAMIN) TABS Take 1 tablet by mouth every morning.    ? ondansetron (ZOFRAN-ODT) 4 MG disintegrating tablet Take 1 tablet (4 mg total) by mouth every 8 (eight) hours as needed for nausea. 30 tablet 3  ? rizatriptan (MAXALT-MLT) 10 MG disintegrating tablet Take 1 tablet (10 mg total) by mouth as needed for migraine. May repeat in 2 hours if needed 9 tablet 11  ? ?No facility-administered medications prior to visit.  ? ? ?PAST MEDICAL HISTORY: ?Past Medical History:  ?Diagnosis Date  ? Acute maxillary sinusitis   ? Asthma   ? Headache(784.0)   ? History of chicken pox   ? Migraines   ? Other current maternal conditions classifiable elsewhere, complicating pregnancy, childbirth, or the puerperium, unspecified as to episode of care   ? glucosuria  ? Other diseases of respiratory system, not elsewhere classified   ? PP care - s/p NVB 3/27 07/24/2011  ? Shingles   ? Sjogren's syndrome ([place])   ? Systemic lupus erythematosus ([place])   ? Thrombocytopenia ([place]) 07/24/2011  ? Thrombocytopenia complicating pregnancy ([place])   ? Von Willebrand disease   ? ? ?PAST SURGICAL HISTORY: ?Past Surgical History:  ?Procedure Laterality Date  ? HAND SURGERY  1999  ? L pinkie  ? TONSILLECTOMY    ? as child had bleeding problems and long healin  ? [place] EXTRACTION  2000  ? ? ?FAMILY HISTORY: ?Family History  ?Problem Relation Age of Onset  ? Birth defects Mother   ?     cleft palate   ? Osteoarthritis Mother   ? Sjogren's syndrome Mother   ? Birth defects Sister   ?     twin w/ internal cleft palate  ? Lupus Sister   ? Stroke Maternal Grandfather   ? Stroke Paternal Grandfather   ? Rheum arthritis Paternal Grandfather   ? Asthma Father   ? Heart Problems Maternal Grandmother   ? ? ?SOCIAL  HISTORY: ?Social History  ? ?Socioeconomic History  ? Marital status: Married  ?  Spouse name: Not on file  ? Number of children: 2  ? Years of education: Not on file  ? Highest education level: Bachelor's degree (e.g., BA, AB, BS)  ?Occupational History  ? Not on file  ?Tobacco Use  ? Smoking status: Never  ? Smokeless tobacco: Never  ?Vaping Use  ? Vaping Use: Never used  ?Substance and Sexual Activity  ? Alcohol use: Yes  ?  Alcohol/week: 1.0 standard drink  ?  Types: 1 Standard drinks or equivalent per week  ? Drug use: No  ? Sexual activity: Yes  ?  Birth control/protection: I.U.D.  ?Other Topics Concern  ? Not on file  ?Social History Narrative  ? Lives at home with  husband Company secretary) and children  ? Left handed  ? Caffeine: 8 oz per day  ? ?Social Determinants of Health  ? ?Financial Resource Strain: Not on file  ?Food Insecurity: Not on file  ?Transportation Needs: Not on file  ?Physical Activity: Not on file  ?Stress: Not on file  ?Social Connections: Not on file  ?Intimate Partner Violence: Not on file  ? ? ?PHYSICAL EXAM ? ?Vitals:  ? 07/23/21 1250  ?BP: 117/68  ?Pulse: 63  ?Weight: 134 lb (60.8 kg)  ?Height: _0  (1.727 m)  ? ? ?Body mass index is 20.37 kg/m?. ? ?Generalized: Well developed, in no acute distress  ?Cardiology: normal rate and rhythm, no murmur noted ?Respiratory: clear to auscultation bilaterally  ?Neurological examination  ?Mentation: Alert oriented to time, place, history taking. Follows all commands speech and language fluent ?Cranial nerve II-XII: Pupils were equal round reactive to light. Extraocular movements were full, visual field were full  ?Motor: The motor testing reveals 5 over 5 strength of all 4 extremities. Good symmetric motor tone is noted throughout.  ?Gait and station: Gait is normal.  ? ?DIAGNOSTIC DATA (LABS, IMAGING, TESTING) ?- I reviewed patient records, labs, notes, testing and imaging myself where available. ? ?   ? View : No data to display.  ?  ?  ?  ?  ? ?Lab  Results  ?Component Value Date  ? WBC 4.7 08/18/2011  ? HGB 12.6 08/18/2011  ? HCT 37.6 08/18/2011  ? MCV 92.8 08/18/2011  ? PLT 143 (L) 08/18/2011  ? ?No results found for: NA, K, CL, CO2, GLUCOSE, BUN, CREATININE, CALC

## 2021-07-23 ENCOUNTER — Encounter: Payer: Self-pay | Admitting: Family Medicine

## 2021-07-23 ENCOUNTER — Other Ambulatory Visit: Payer: Self-pay

## 2021-07-23 ENCOUNTER — Ambulatory Visit: Payer: No Typology Code available for payment source | Admitting: Family Medicine

## 2021-07-23 VITALS — BP 117/68 | HR 63 | Ht 68.0 in | Wt 134.0 lb

## 2021-07-23 DIAGNOSIS — G43709 Chronic migraine without aura, not intractable, without status migrainosus: Secondary | ICD-10-CM | POA: Diagnosis not present

## 2021-07-23 MED ORDER — ONDANSETRON 4 MG PO TBDP: 4.0000 mg | ORAL_TABLET | Freq: Three times a day (TID) | ORAL | 3 refills | Status: AC | PRN

## 2021-07-23 MED ORDER — RIZATRIPTAN BENZOATE 10 MG PO TBDP: 10.0000 mg | ORAL_TABLET | ORAL | 11 refills | Status: AC | PRN
